# Patient Record
Sex: Female | Born: 1970
Health system: Southern US, Community
[De-identification: ages and names within clinical notes are randomized; demographics above are authoritative.]

## PROBLEM LIST (undated history)

## (undated) DIAGNOSIS — N3010 Interstitial cystitis (chronic) without hematuria: Secondary | ICD-10-CM

## (undated) DIAGNOSIS — F319 Bipolar disorder, unspecified: Secondary | ICD-10-CM

## (undated) DIAGNOSIS — M797 Fibromyalgia: Secondary | ICD-10-CM

## (undated) DIAGNOSIS — K589 Irritable bowel syndrome without diarrhea: Secondary | ICD-10-CM

## (undated) DIAGNOSIS — F410 Panic disorder [episodic paroxysmal anxiety] without agoraphobia: Secondary | ICD-10-CM

## (undated) DIAGNOSIS — F419 Anxiety disorder, unspecified: Secondary | ICD-10-CM

## (undated) DIAGNOSIS — F4481 Dissociative identity disorder: Secondary | ICD-10-CM

## (undated) DIAGNOSIS — G43909 Migraine, unspecified, not intractable, without status migrainosus: Secondary | ICD-10-CM

## (undated) DIAGNOSIS — F32A Depression, unspecified: Secondary | ICD-10-CM

## (undated) HISTORY — DX: Irritable bowel syndrome, unspecified: K58.9

## (undated) HISTORY — DX: Migraine, unspecified, not intractable, without status migrainosus: G43.909

## (undated) HISTORY — PX: OTHER SURGICAL HISTORY: SHX169

## (undated) HISTORY — DX: Fibromyalgia: M79.7

## (undated) HISTORY — DX: Interstitial cystitis (chronic) without hematuria: N30.10

## (undated) HISTORY — PX: BREAST LUMPECTOMY: SHX2

## (undated) HISTORY — PX: ABDOMINAL HYSTERECTOMY: SHX81

## (undated) HISTORY — PX: CHOLECYSTECTOMY: SHX55

## (undated) HISTORY — PX: BLADDER SURGERY: SHX569

## (undated) HISTORY — PX: APPENDECTOMY: SHX54

## (undated) HISTORY — DX: Depression, unspecified: F32.A

---

## 2000-02-20 ENCOUNTER — Encounter: Admission: RE | Admit: 2000-02-20 | Discharge: 2000-02-20 | Payer: Self-pay | Admitting: Gastroenterology

## 2000-02-20 ENCOUNTER — Encounter: Payer: Self-pay | Admitting: Gastroenterology

## 2000-04-09 ENCOUNTER — Encounter (INDEPENDENT_AMBULATORY_CARE_PROVIDER_SITE_OTHER): Payer: Self-pay | Admitting: Specialist

## 2000-04-09 ENCOUNTER — Inpatient Hospital Stay (HOSPITAL_COMMUNITY): Admission: RE | Admit: 2000-04-09 | Discharge: 2000-04-11 | Payer: Self-pay | Admitting: Obstetrics and Gynecology

## 2002-01-26 ENCOUNTER — Encounter: Payer: Self-pay | Admitting: Obstetrics and Gynecology

## 2002-01-26 ENCOUNTER — Encounter: Admission: RE | Admit: 2002-01-26 | Discharge: 2002-01-26 | Payer: Self-pay | Admitting: Obstetrics and Gynecology

## 2002-02-03 ENCOUNTER — Ambulatory Visit (HOSPITAL_BASED_OUTPATIENT_CLINIC_OR_DEPARTMENT_OTHER): Admission: RE | Admit: 2002-02-03 | Discharge: 2002-02-03 | Payer: Self-pay | Admitting: *Deleted

## 2002-02-03 ENCOUNTER — Encounter (INDEPENDENT_AMBULATORY_CARE_PROVIDER_SITE_OTHER): Payer: Self-pay | Admitting: Specialist

## 2002-08-05 ENCOUNTER — Encounter: Payer: Self-pay | Admitting: Internal Medicine

## 2002-08-05 ENCOUNTER — Emergency Department (HOSPITAL_COMMUNITY): Admission: EM | Admit: 2002-08-05 | Discharge: 2002-08-05 | Payer: Self-pay | Admitting: *Deleted

## 2003-06-02 ENCOUNTER — Emergency Department (HOSPITAL_COMMUNITY): Admission: EM | Admit: 2003-06-02 | Discharge: 2003-06-03 | Payer: Self-pay | Admitting: Emergency Medicine

## 2004-02-01 ENCOUNTER — Other Ambulatory Visit: Admission: RE | Admit: 2004-02-01 | Discharge: 2004-02-01 | Payer: Self-pay | Admitting: Obstetrics and Gynecology

## 2004-02-08 ENCOUNTER — Encounter: Admission: RE | Admit: 2004-02-08 | Discharge: 2004-02-08 | Payer: Self-pay | Admitting: Obstetrics and Gynecology

## 2004-03-06 ENCOUNTER — Encounter: Admission: RE | Admit: 2004-03-06 | Discharge: 2004-03-06 | Payer: Self-pay | Admitting: Gastroenterology

## 2004-05-14 ENCOUNTER — Encounter: Admission: RE | Admit: 2004-05-14 | Discharge: 2004-05-14 | Payer: Self-pay | Admitting: Obstetrics and Gynecology

## 2004-09-07 ENCOUNTER — Encounter
Admission: RE | Admit: 2004-09-07 | Discharge: 2004-09-07 | Payer: Self-pay | Admitting: Physical Medicine and Rehabilitation

## 2005-09-02 ENCOUNTER — Encounter: Admission: RE | Admit: 2005-09-02 | Discharge: 2005-09-02 | Payer: Self-pay | Admitting: Obstetrics & Gynecology

## 2005-10-05 ENCOUNTER — Emergency Department (HOSPITAL_COMMUNITY): Admission: EM | Admit: 2005-10-05 | Discharge: 2005-10-05 | Payer: Self-pay | Admitting: Emergency Medicine

## 2005-10-06 ENCOUNTER — Inpatient Hospital Stay (HOSPITAL_COMMUNITY): Admission: RE | Admit: 2005-10-06 | Discharge: 2005-10-08 | Payer: Self-pay | Admitting: Psychiatry

## 2005-10-06 ENCOUNTER — Ambulatory Visit: Payer: Self-pay | Admitting: Psychiatry

## 2006-03-31 ENCOUNTER — Ambulatory Visit: Payer: Self-pay | Admitting: Internal Medicine

## 2006-04-01 ENCOUNTER — Ambulatory Visit: Payer: Self-pay | Admitting: Internal Medicine

## 2006-05-04 ENCOUNTER — Ambulatory Visit: Payer: Self-pay | Admitting: Internal Medicine

## 2006-07-03 ENCOUNTER — Ambulatory Visit (HOSPITAL_COMMUNITY): Admission: RE | Admit: 2006-07-03 | Discharge: 2006-07-03 | Payer: Self-pay | Admitting: *Deleted

## 2006-07-03 ENCOUNTER — Encounter (INDEPENDENT_AMBULATORY_CARE_PROVIDER_SITE_OTHER): Payer: Self-pay | Admitting: Specialist

## 2008-02-16 ENCOUNTER — Ambulatory Visit: Payer: Self-pay | Admitting: Internal Medicine

## 2008-04-04 ENCOUNTER — Encounter (INDEPENDENT_AMBULATORY_CARE_PROVIDER_SITE_OTHER): Payer: Self-pay | Admitting: Urology

## 2008-04-04 ENCOUNTER — Ambulatory Visit (HOSPITAL_BASED_OUTPATIENT_CLINIC_OR_DEPARTMENT_OTHER): Admission: RE | Admit: 2008-04-04 | Discharge: 2008-04-04 | Payer: Self-pay | Admitting: Urology

## 2008-12-22 ENCOUNTER — Encounter: Payer: Self-pay | Admitting: Internal Medicine

## 2009-08-23 ENCOUNTER — Encounter: Admission: RE | Admit: 2009-08-23 | Discharge: 2009-09-20 | Payer: Self-pay | Admitting: Anesthesiology

## 2010-12-22 ENCOUNTER — Encounter: Payer: Self-pay | Admitting: Internal Medicine

## 2011-04-15 NOTE — Op Note (Signed)
NAMEADRAINE, Alison Blake             ACCOUNT NO.:  [id number]   MEDICAL RECORD NO.:  [id number]          PATIENT TYPE:  AMB   LOCATION:  [facility]                         FACILITY:  [facility]   PHYSICIAN:  Jamison Neighbor, M.D.  DATE OF BIRTH:  [birth year]   DATE OF PROCEDURE:  04/04/2008  DATE OF DISCHARGE:                               OPERATIVE REPORT   PREOPERATIVE DIAGNOSIS:  Interstitial cystitis.   POSTOPERATIVE DIAGNOSIS:  Interstitial cystitis.   PROCEDURE:  Cystoscopy, urethral calibration, hydrodistention of the  bladder, Marcaine and Pyridium instillation, Marcaine and Kenalog  injection, bladder biopsy for mast cell analysis.   SURGEON:  Jamison Neighbor, M.D.   ANESTHESIA:  General.   COMPLICATIONS:  None.   DRAINS:  None.   BRIEF HISTORY:  This 40 year old female has had chronic pelvic pain.  She is to undergo evaluation to determine if she might have interstitial  cystitis.  The patient does have a history of possible endometriosis,  and it has been noted that there was a small tiny lesion in the right  side of the vagina, and there has been some question on the part of her  gynecologist whether this might represent recurrent endometriosis.  The  patient is now to undergo cystoscopy, urethral calibration,  hydrodistention of the bladder, Marcaine and Pyridium instillation,  Marcaine and Kenalog injection, and examination under anesthesia to  evaluate this lesion.  She understands the risks and benefits of the  procedure.  She is aware of the fact that there is no guarantee she will  have improvement in her symptoms, but if she does have interstitial  cystitis, there is a reasonable chance that she will have at least  temporary relief from her pain.  Relief, however, will not occur for at  least 1 week.  She gave full informed consent.   PROCEDURE:  After successful induction of general anesthesia, the  patient was placed in the dorsal position, prepped with Betadine,  and  draped in the usual sterile fashion.   Careful bimanual examination revealed an unremarkable vagina, with the  exception of one tiny benign-appearing lesion on the right-hand side.  This was actually difficult to visualize and can be felt more than seen.  It certainly did not represent significant evidence of any  endometriosis.  There was no active bleeding associated with this small  lesion which certainly appeared to be benign in nature.  It was quite  tiny, certainly not measuring anymore than 1 mm.  The urethra was  calibrated to 32-French with female urethral sounds, with no evidence of  stenosis or stricture.  The cystoscope was inserted.  The bladder was  carefully inspected.  No tumors or stones could be seen. Both ureteral  orifices were normal in configuration and location.  Careful inspection  showed no evidence of any endometrial implants within the bladder.  Hydrodistention of bladder was performed.  The bladder was distended at  a pressure of 100 cm of water for 5 minutes.  When the bladder was  drained, glomerulations could seen throughout the bladder.  These were  felt to be consistent with  interstitial cystitis.  A bladder capacity of  about 700 mL was diminished while not low as an average IC bladder  capacity of 575.  This certainly was less than the normal bladder  capacity of 1150 mL.  The patient had a biopsy performed.  The biopsy  site was cauterized.  This will be sent for mast cell analysis.  The  bladder was drained.  A mixture of Marcaine and Pyridium was left in the  bladder.  Marcaine and Kenalog were injected periurethrally.   The patient tolerated the procedure and was taken to the recovery room  in good condition.      Jamison Neighbor, M.D.  Electronically Signed     RJE/MEDQ  D:  04/04/2008  T:  04/04/2008  Job:  [id number]

## 2011-04-15 NOTE — Assessment & Plan Note (Signed)
[facility]                         GASTROENTEROLOGY OFFICE NOTE   NAME:WRIGHTCheney, Gosch                    MRN:          [id number]  DATE:02/16/2008                            DOB:          July 28, 1971    Ms. Chuong is a 40 year old white female with chronic abdominal pain,  history of endometriosis, followed by Dr. Maudry Diego, now with some urinary  symptoms, suspicious of interstitial cystitis.  She has symptomatic  gastroesophageal reflux, evaluated by upper endoscopy in June 2007.  Her  CLOtest was negative.  Last colonoscopy for evaluation of constipation  was in June 2007 and showed small internal hemorrhoids, no evidence of  mucosal disease.  There were no polyps.   The patient's symptoms continue to be those of constant nausea,  constipation, lower abdominal pain, alternating left and right lower  quadrants, and heartburn.   MEDICATIONS:  1. Xanax 0.25 mg p.r.n.  2. MiraLax 17-34 g p.r.n.  3. Tramadol 50 mg up to 120 a month.  4. Equetrol 300 mg daily.  5. Gynodiol 1 mg p.o. daily.   PHYSICAL EXAM:  Blood pressure 110/72, pulse 72 and weight 207 pounds.  Patient was in no distress.  She appeared somewhat depressed.  NECK:  Supple, no adenopathy.  LUNGS:  Clear to auscultation.  COR:  With quiet precordium, normal S1, normal S2.  ABDOMEN:  Soft and nondistended, with normoactive bowel sounds.  There  was marked diffuse tenderness, more so in left lower quadrant and left  middle quadrant, without palpable mass or fullness.  Right lower  quadrant was quite tender and also in suprapubic area, over the bladder,  there was some discomfort.  RECTAL EXAM:  Perianal area was normal, but she had very tender anal  canal on digital exam, although there was no fissure.  On anoscopy, she  had small internal hemorrhoids, which were first-grade, without prolapse  or any thrombosis.  Stool was Hemoccult-negative.   IMPRESSION:  1. Patient is a  40 year old white female with history of pelvic      endometriosis, status post TAH, BSO, followed by Dr. Maudry Diego.  2. Suprapubic discomfort.  Rule out interstitial cystitis.  Patient is      to be referred to Dr. Logan Bores.  3. Irritable bowel syndrome/constipation, inadequately controlled.      Patient needs to increase her laxatives.  4. Gastroesophageal reflux disease, not treated.  Needs to be further      treated.  5. Status post remote cholecystectomy in 2007.   PLAN:  1. Pantoprazole 40 mg with 6 refills.  2. MiraLax 17 g three times a week on a regular basis, rather than      p.r.n.  3. Anusol-HC suppositories q.h.s. with 3 refills.  4. Phenergan 25 mg every 6 hours p.r.n. break-through nausea.     Hedwig Morton. Juanda Chance, MD  Electronically Signed    DMB/MedQ  DD: 02/16/2008  DT: 02/16/2008  Job #: [id number]   cc:   Christianne Borrow

## 2011-04-18 NOTE — H&P (Signed)
[place]  Patient:    Alison Blake, Alison Blake                      MRN: [id number] Attending:  Beather Arbour. Thomasena Edis, M.D. CC:         [facility] outpatient operating room             [facility] office                         History and Physical  HISTORY OF PRESENT ILLNESS:  The patient is a 40 year old GII, PII, who underwent a total abdominal hysterectomy in 1999, for severe dysmenorrhea and known endometriosis.  The patient did well for quite a while, until she presented in December, complaining of pain in her left lower quadrant.  She did a pain diary and was found to have pain most days.  Ultrasound was performed and revealed a simple cyst, 5.8 x 4.2 x 3.8 cm.  It had no worrisome features for malignancy.  The patient was followed expectantly and placed on oral contraceptives to promote resolution of the cyst.  The patients recent ultrasound showed the cyst still present at 2.4 x 1.9 x 2.4 cm.  This has remained, in essence, unchanged since January, 2001.  The patient continues to complain of debilitating left lower quadrant pain such that she has to miss work.  She is quite debilitated by this pain.  She had a GI work-up by Dr.  Loreta Ave, which was negative, with the exception of a diagnosis of irritable bowel syndrome.  The patient describes the pain as a "pulling pain" and states it feels "as if it is going to pull right through to her vagina."  She fully understands that to remove this ovary, leaves her one ovary, and that at age 59, I do not think it is advisable unless absolutely necessary to remove both ovaries due to the induction of menopause that this would cause.  The patient is adamant that this ovary be removed, and she is admitted for a left oophorectomy, due to persistent ovarian cysts, which are not resolved on oral contraceptives, as well as severe and debilitating left lower quadrant pain.  The patient  fully understands that this left oophorectomy does not guarantee that she will be pain-free, and she expresses understanding and acceptance of these risks.  Risks of surgery, including anesthetic complication, hemorrhage, infection, damage to adjacent structures, including bowel, bladder, blood vessels and ureters, was discussed with the patient.  She is made aware of the risk of deep venous thrombosis, which could result in a pulmonary embolism or stroke.  She expresses understanding and acceptance of these risks.  Ultrasound does show the ovaries to be located up against the pelvic sidewall and the patient does understand that, due to her previous surgery and endometriosis, that the section may be difficult.  She is thus admitted for exploratory laparotomy and left oophorectomy and probable lysis of adhesions.  PAST MEDICAL HISTORY:  Significant for hysterectomy, as above.  History of mood  disorder, or depression, treated by Dr. Milagros Evener.  History of laparoscopy, for endometriosis in 1998.  History of spontaneous vaginal delivery x 2. Occasional migraine headaches.  Also, history of endometriosis, as above.  FAMILY HISTORY:  Significant for retardation in paternal aunt, paternal cousin 2.  ALLERGIES:  PENICILLIN causes rash and hives.  CODEINE causes nausea and vomiting, DOXYCYCLINE.  SOCIAL HISTORY:  The  patient is married, has two children and works for ______  [employer].  MEDICATIONS:  Topamax 200 mg daily, Celexa 40 mg daily and Ortho-Cyclen oral contraceptive.  PHYSICAL EXAMINATION:  HEENT:  Normal.  NECK:  Supple, without thyromegaly.  LUNGS:  Clear to auscultation.  CARDIAC:  Exam - regular rate and rhythm.  ABDOMEN:  Soft, nontender; no hepatosplenomegaly.  Well-healed Pfannenstiel incision.  PELVIC:  BUS normal.  The patients vaginal cuff is well-healed.  Examination is  negative with the exception of the reproduction of the left lower  quadrant pain  with palpation over the left ovary.  Uterus and cervix are surgically absent.  RECTAL:  Examination is confirmatory.  ASSESSMENT/PLAN:  The patient is a 40 year old GII, PII, with a known history of endometriosis and adenomyosis, status post TAH in 1999, admitted for a left oophorectomy and lysis of adhesions due to persistent ovarian cysts, despite treatment with oral contraceptives and persistent debilitating pain.  The patient fully understands that to remove this ovary may not cure her pain, and expresses understanding and acceptance of this. DD:  04/09/00 TD:  04/09/00 Job: [id number]

## 2011-04-18 NOTE — Op Note (Signed)
Alison Blake, Alison Blake             ACCOUNT NO.:  [id number]   MEDICAL RECORD NO.:  [id number]          PATIENT TYPE:  AMB   LOCATION:  DAY                          FACILITY:  [facility]   PHYSICIAN:  Alfonse Ras, MD   DATE OF BIRTH:  1971/02/24   DATE OF PROCEDURE:  07/03/2006  DATE OF DISCHARGE:                                 OPERATIVE REPORT   PREOPERATIVE DIAGNOSIS:  Biliary dyskinesia.   POSTOPERATIVE DIAGNOSIS:  Biliary dyskinesia.  Normal cholangiogram.   PROCEDURE:  Laparoscopic cholecystectomy with intraoperative cholangiogram.   SURGEON:  Alfonse Ras, M.D.   ASSISTANT:  Sheppard Plumber. Earlene Plater, M.D.   ANESTHESIA:  General.   DESCRIPTION OF PROCEDURE:  The patient was taken to the operating room and  placed in the supine position.  After adequate general anesthesia was  induced using the endotracheal tube, the abdomen was prepped and draped in  the normal sterile fashion.  Through the transverse infra-umbilical incision  I dissected down the fascia.  The fascia was opened vertically and the  peritoneum was entered.  A #0 Vicryl pursestring was placed around the  fascial defect and the Hasson trocar was placed in the abdomen.  A  pneumoperitoneum was obtained.  A sub-xiphoid __________ trocar was  introduced under direct vision.  Two additional 5 mm trocars were placed in  the right abdomen.  The gallbladder was identified and retracted cephalad.  Dissecting at the infundibulum of the gallbladder and down to the neck, the  cystic duct was easily identified.  It was quite small.  It was clipped up  near the gallbladder.  A small ductotomy was made and a cholangiogram was  performed, which showed a normal common bile duct, normal filling of both  right and left hepatic ducts and free flow into the duodenum without filling  defects.  The cholangio-catheter was then removed.  The cystic duct was  clipped and divided.  The cystic artery was identified, clipped and divided  as  well.  The gallbladder was taken off the gallbladder bed using the Bovie  electrocautery and removed through the umbilical port.  Adequate hemostasis  was assured.  The fascial defect was closed with the previously-placed #0  Vicryl pursestring suture.  The skin incisions were closed with subcuticular  #4-0 Monocryl.  They were injected with 0.5% Marcaine.  Steri-Strip sterile  dressings were applied.   The patient tolerated the procedure well and went to the PACU in good  condition.      Alfonse Ras, MD  Electronically Signed     KRE/MEDQ  D:  07/03/2006  T:  07/03/2006  Job:  [id number]

## 2011-04-18 NOTE — Discharge Summary (Signed)
NAMEJAMEELAH, WATTS NO.:  [id number]   MEDICAL RECORD NO.:  [id number]          PATIENT TYPE:  IPS   LOCATION:  0403                          [facility]ILITY[facility]  [facility]   PHYSICIAN:  Jeanice Lim, M.D. DATE OF BIRTH:  1971/01/16   DATE OF ADMISSION:  10/06/2005  DATE OF DISCHARGE:  10/08/2005                                 DISCHARGE SUMMARY   IDENTIFYING DATA:  This is a voluntary admission for this 40 year old  married Caucasian female who apparently took several Xanax to go to sleep.  Reports normally does not sleep well, has bad dreams, had been dreaming  about snakes being in her bed, people chasing her, complaining of paranoia  and rapid mood swings.  Reports she can just be sitting there and then the  mood will suddenly shift.  Began seeing Dr. Evelene Croon in 2000.  Had been arrested  for shoplifting.   PRIMARY CARE PHYSICIAN:  [facility].   MEDICATIONS:  Estrogen, replaced with estradiol, Xanax 2 mg t.i.d. and  unclear regarding the other medications.  The patient was very concerned  about any medication that might cause her to gain weight.   PHYSICAL EXAMINATION:  Physical and neurologic exam essentially within  normal limits.  She is 5 feet 6 inches tall and weighs 193 pounds.  Vital  signs stable.  Afebrile.   LABORATORY DATA:  Routine admission labs within normal limits.   MENTAL STATUS EXAM:  Alert and oriented.  Appropriately dressed and groomed.  No psychomotor abnormalities.  Speech within normal limits.  Mood depressed.  Affect a bit labile, upset when she realized she would not be returning home  the same night of her admission.  Reports she has never been separated from  her children.  Thought processes were mostly goal directed.  Wanted to be  discharged.  Judgment and insight were impaired.  Cognition intact.  Denied  acute suicidal or homicidal ideation or auditory or visual hallucinations.  Reports that she does hear a voice at times,  stating I'm no good, I'm not  worth living and they call me fat..   ADMISSION DIAGNOSES:  AXIS I:  Bipolar disorder, mixed state possible with  psychotic features.  AXIS II:  Rule out personality disorder.  AXIS III:  Migraines, irritable bowel syndrome, obesity.  AXIS IV:  Moderate (stressors with primary support group, problems related  to legal system, had a shoplifting charge in 2000).  AXIS V:  28/55-60.   HOSPITAL COURSE:  The patient was admitted and ordered routine p.r.n.  medications and underwent further monitoring.  Was encouraged to participate  in individual, group and milieu therapy.  The patient signed a 72-hour  request for discharge and Dr. Carie Caddy office was contacted regarding safety  issues and treatment plan recommendations.  The patient was placed on a low-  dose Librium detox protocol, Haldol 5 mg p.r.n. agitation and Geodon was  started and given IM initially for agitation and psychosis and then b.i.d.  along with Depakote for mood instability and Ambien to restore sleep.  Depakote level was monitored and patient was discharged in  improved  condition, responding and tolerating the medications.  Risk/benefit ratio  and alternative treatments were discussed regarding medications.  The  patient was comfortable with medication regimen, aware of metabolic issues,  risk of TD, EPS and sedation precautions.   CONDITION ON DISCHARGE:  She was discharged with a stable mood, no dangerous  ideation, no psychotic symptoms, improved judgment and insight.  Given  medication education.   DISCHARGE MEDICATIONS:  She was to continue on estradiol as prescribed by  family doctor.  Geodon was stopped due to nausea and sedation.  The patient  was detoxed off of Xanax and Depakote had been started 500 mg at 8 p.m. and  Ambien 10 mg q.h.s. p.r.n.  The patient's TSH was 1.131 and Depakote level  was not available at the time of discharge but pending and could be sent to  Dr. Carie Caddy  office upon request.   DISCHARGE DIAGNOSES:  AXIS I:  Bipolar disorder, mixed state possible with  psychotic features.  AXIS II:  Rule out personality disorder.  AXIS III:  Migraines, irritable bowel syndrome, obesity.  AXIS IV:  Moderate (stressors with primary support group, problems related  to legal system, had a shoplifting charge in 2000).  AXIS V:  GAF on discharge 55.      Jeanice Lim, M.D.  Electronically Signed     JEM/MEDQ  D:  10/12/2005  T:  10/13/2005  Job:  [id number]

## 2011-04-18 NOTE — H&P (Signed)
Alison Blake, Alison Blake NO.:  [id number]   MEDICAL RECORD NO.:  [id number]          PATIENT TYPE:  IPS   LOCATION:  0403                          [facility]ACILITY:  [facility]   PHYSICIAN:  Jeanice Lim, M.D. DATE OF BIRTH:  16-Jan-1971   DATE OF ADMISSION:  10/06/2005  DATE OF DISCHARGE:                         PSYCHIATRIC ADMISSION ASSESSMENT   IDENTIFYING INFORMATION:  This is a voluntary admission to the services of  Dr. Aleatha Borer.  This is a 40 year old married white female.  Apparently,  she took several Xanax to go to sleep.  She does not sleep well.  She has  bad dreams.  Recently, she has been dreaming about snakes being in her bed  and people chasing after her.  She states if we could get a medicine that  would help fix the paranoia, that is what she would like.  She states that  she has rapid mood swings.  She states that she can just be sitting there,  not doing anything and her mood will just change on her.  She feels a  sensation go up her body and it just makes her change.  She states that  often she will then say things that other people find hurtful and she states  she does not really remember what she says to people while she is in these  states.  She assures me that Dr. Evelene Croon says that this is just her mood  swings.   PAST PSYCHIATRIC HISTORY:  She denies any inpatient stays.  Apparently, she  began seeing Dr. Evelene Croon in 2000 after she was arrested for shoplifting.   SOCIAL HISTORY:  She has a GED.  Apparently, she became pregnant after the  eighth grade and ultimately got a GED.  Her son will be 24 and her daughter  is almost 41.  She has only been married the one time.  Apparently, she is a  stay-at-home mom.   FAMILY HISTORY:  She denies but apparently her father is known to have mood  swings and she thinks her daughter may have bipolar disorder as well.   PRIMARY CARE PHYSICIAN:  She goes to [facility].   MEDICAL HISTORY:  She is status post  a hysterectomy at age 65.  She is now  also status post bilateral salpingo-oophorectomy and she has also had a  lumpectomy of the right breast.   MEDICATIONS:  She is estrogen replaced with estradiol.  She also takes Xanax  2 mg t.i.d. and she is not clear as to exactly what medications Dr. Evelene Croon has  her on currently.  Apparently, she took the prescription to the pharmacy but  could not afford it and she is very concerned about any medications that  might make her gain weight.   PHYSICAL EXAMINATION:  Physical examination is unremarkable except for being  somewhat obese.  Her physical is as per the ER.  She is 5 feet 6 inches and  weighs 193 pounds.  Her vital signs are stable.   LABORATORY DATA:  She had no other abnormal labs on the labs that were done  in the  emergency room.   MENTAL STATUS EXAM:  She is alert.  She is oriented x3.  She is  appropriately if be it casually groomed and dressed.  She is adequately  nourished.  Her speech is not pressured.  Her mood starts out depressed.  Her affect was flat but became labile.  She became very upset when she  realized that she would not be returning home tonight.  She states this is  the first time she has ever been separated from her children.  Her thought  processes are clear, rational and goal-oriented.  She wants discharge.  Judgment and insight are intact.  Concentration and memory are intact.  Intelligence is at least average.  She denies suicidal or homicidal  ideation.  She denies auditory or visual hallucinations per se.  She states  that she feels like people are outside the house watching her.  She does  hear voices at times stating I'm no good and I'm not worth living.  They  call me fat.  The voices call her fat.  She states that she has an  appointment this Friday with Dr. Evelene Croon that she would like to keep.  She  reports having been on a myriad of medications in the past without benefit  including lithium, Depakote,  Tegretol, Wellbutrin, Effexor, Zoloft and maybe  a couple of others.   DIAGNOSES:  AXIS I:  Bipolar disorder, mixed.  Panic, she states, and that  may be from being afraid.  She states that she takes the 2 mg of Xanax  t.i.d. whether she feels panic or not.  AXIS II:  Rule out personality disorder.  AXIS III:  Migraines, irritable bowel syndrome, obesity.  AXIS IV:  Problems with primary support group and problems related to the  legal system.  She had a shoplifting charge in 2000.  AXIS V:  28.   PLAN:  To admit for safety and stabilization.  The patient had already  signed a 72 hour request for discharge.  We will consult with Dr. Evelene Croon in  the morning and, if Dr. Evelene Croon is confident that the patient is not suicidal,  I imagine she can be discharged and let Dr. Evelene Croon and the patient resume  their therapeutic alliance.      Mickie Leonarda Salon, P.A.-C.      Jeanice Lim, M.D.  Electronically Signed    MD/MEDQ  D:  10/06/2005  T:  10/06/2005  Job:  [id number]

## 2012-07-19 ENCOUNTER — Other Ambulatory Visit: Payer: Self-pay | Admitting: Family Medicine

## 2012-07-19 DIAGNOSIS — Z1231 Encounter for screening mammogram for malignant neoplasm of breast: Secondary | ICD-10-CM

## 2012-08-04 ENCOUNTER — Ambulatory Visit: Payer: Self-pay

## 2012-08-04 ENCOUNTER — Ambulatory Visit
Admission: RE | Admit: 2012-08-04 | Discharge: 2012-08-04 | Disposition: A | Discharge status: Home / Self Care | Payer: Medicare Other | Source: Ambulatory Visit | Attending: Family Medicine | Admitting: Family Medicine

## 2012-08-04 DIAGNOSIS — Z1231 Encounter for screening mammogram for malignant neoplasm of breast: Secondary | ICD-10-CM

## 2012-08-24 ENCOUNTER — Other Ambulatory Visit: Payer: Self-pay | Admitting: Otolaryngology

## 2012-08-24 DIAGNOSIS — J329 Chronic sinusitis, unspecified: Secondary | ICD-10-CM

## 2012-09-03 ENCOUNTER — Ambulatory Visit
Admission: RE | Admit: 2012-09-03 | Discharge: 2012-09-03 | Disposition: A | Discharge status: Home / Self Care | Payer: Medicare Other | Source: Ambulatory Visit | Attending: Otolaryngology | Admitting: Otolaryngology

## 2012-09-03 DIAGNOSIS — J329 Chronic sinusitis, unspecified: Secondary | ICD-10-CM

## 2013-01-21 ENCOUNTER — Encounter (HOSPITAL_COMMUNITY): Payer: Self-pay | Admitting: Emergency Medicine

## 2013-01-21 ENCOUNTER — Emergency Department (HOSPITAL_COMMUNITY)
Admission: EM | Admit: 2013-01-21 | Discharge: 2013-01-21 | Disposition: A | Discharge status: Home / Self Care | Payer: Medicare Other | Attending: Emergency Medicine | Admitting: Emergency Medicine

## 2013-01-21 DIAGNOSIS — F319 Bipolar disorder, unspecified: Secondary | ICD-10-CM

## 2013-01-21 DIAGNOSIS — F411 Generalized anxiety disorder: Secondary | ICD-10-CM | POA: Insufficient documentation

## 2013-01-21 DIAGNOSIS — F17200 Nicotine dependence, unspecified, uncomplicated: Secondary | ICD-10-CM | POA: Insufficient documentation

## 2013-01-21 DIAGNOSIS — F609 Personality disorder, unspecified: Secondary | ICD-10-CM | POA: Insufficient documentation

## 2013-01-21 DIAGNOSIS — G4700 Insomnia, unspecified: Secondary | ICD-10-CM | POA: Insufficient documentation

## 2013-01-21 DIAGNOSIS — R443 Hallucinations, unspecified: Secondary | ICD-10-CM | POA: Insufficient documentation

## 2013-01-21 DIAGNOSIS — Z79899 Other long term (current) drug therapy: Secondary | ICD-10-CM | POA: Insufficient documentation

## 2013-01-21 DIAGNOSIS — F259 Schizoaffective disorder, unspecified: Secondary | ICD-10-CM | POA: Insufficient documentation

## 2013-01-21 HISTORY — DX: Dissociative identity disorder: F44.81

## 2013-01-21 HISTORY — DX: Panic disorder (episodic paroxysmal anxiety): F41.0

## 2013-01-21 HISTORY — DX: Bipolar disorder, unspecified: F31.9

## 2013-01-21 HISTORY — DX: Anxiety disorder, unspecified: F41.9

## 2013-01-21 LAB — COMPREHENSIVE METABOLIC PANEL
ALT: 10 U/L (ref 0–35)
AST: 14 U/L (ref 0–37)
BUN: 9 mg/dL (ref 6–23)
Creatinine, Ser: 0.91 mg/dL (ref 0.50–1.10)
GFR calc Af Amer: 90 mL/min — ABNORMAL LOW (ref >90)
GFR calc non Af Amer: 77 mL/min — ABNORMAL LOW (ref >90)
Glucose, Bld: 118 mg/dL — ABNORMAL HIGH (ref 70–99)

## 2013-01-21 LAB — RAPID URINE DRUG SCREEN, HOSP PERFORMED
Barbiturates: NOT DETECTED
Benzodiazepines: POSITIVE — AB
Opiates: NOT DETECTED
Tetrahydrocannabinol: NOT DETECTED

## 2013-01-21 LAB — CBC
MCHC: 35.4 g/dL (ref 30.0–36.0)
MCV: 93.8 fL (ref 78.0–100.0)
RBC: 4.37 MIL/uL (ref 3.87–5.11)

## 2013-01-21 LAB — ETHANOL: Alcohol, Ethyl (B): <11 mg/dL (ref 0–11)

## 2013-01-21 NOTE — ED Notes (Signed)
Pt very tearful in triage  Pt states she was made to come here  Pt's son states his father came to the house tonight with a couple of women that have been harassing his mother  States the police were there as well  Pt states the women were standing in the street yelling and cursing because she did not want them on her property  Pt states she became upset and said some things out of anger the police took as she had intent to hurt herself and made her come in here  Pt has bipolar, borderline schizophrenia, and multiple personality disorder  Pt states she does have a hx of attempting suicide back in 2006  Pt and the father of her children are no longer together and have not been for about 5 years
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
## 2013-01-21 NOTE — ED Provider Notes (Signed)
Medical screening examination/treatment/procedure(s) were performed by non-physician practitioner and as supervising physician I was immediately available for consultation/collaboration.  Jones Skene, M.D.     Jones Skene, MD 01/21/13 1106

## 2013-01-21 NOTE — ED Notes (Signed)
PA in room with pt.

## 2013-01-21 NOTE — ED Notes (Signed)
While pt in triage she made comments like "living in hell would be better than living in this hell"

## 2013-12-13 ENCOUNTER — Other Ambulatory Visit: Payer: Self-pay

## 2013-12-13 DIAGNOSIS — Z1231 Encounter for screening mammogram for malignant neoplasm of breast: Secondary | ICD-10-CM

## 2014-01-04 ENCOUNTER — Ambulatory Visit
Admission: RE | Admit: 2014-01-04 | Discharge: 2014-01-04 | Disposition: A | Discharge status: Home / Self Care | Payer: Medicare Other | Source: Ambulatory Visit

## 2014-01-04 ENCOUNTER — Other Ambulatory Visit: Payer: Self-pay

## 2014-01-04 DIAGNOSIS — Z1231 Encounter for screening mammogram for malignant neoplasm of breast: Secondary | ICD-10-CM

## 2014-11-08 ENCOUNTER — Other Ambulatory Visit: Payer: Self-pay | Admitting: Family Medicine

## 2014-11-08 DIAGNOSIS — N6459 Other signs and symptoms in breast: Secondary | ICD-10-CM

## 2014-11-22 ENCOUNTER — Ambulatory Visit
Admission: RE | Admit: 2014-11-22 | Discharge: 2014-11-22 | Disposition: A | Discharge status: Home / Self Care | Payer: Medicare Other | Source: Ambulatory Visit | Attending: Family Medicine | Admitting: Family Medicine

## 2014-11-22 DIAGNOSIS — N6459 Other signs and symptoms in breast: Secondary | ICD-10-CM

## 2015-01-05 ENCOUNTER — Other Ambulatory Visit: Payer: Self-pay

## 2015-01-05 ENCOUNTER — Other Ambulatory Visit: Payer: Self-pay | Admitting: Family Medicine

## 2015-01-05 DIAGNOSIS — N644 Mastodynia: Secondary | ICD-10-CM

## 2015-01-09 ENCOUNTER — Other Ambulatory Visit: Payer: Self-pay | Admitting: Family Medicine

## 2015-01-09 DIAGNOSIS — N644 Mastodynia: Secondary | ICD-10-CM

## 2015-01-16 ENCOUNTER — Ambulatory Visit
Admission: RE | Admit: 2015-01-16 | Discharge: 2015-01-16 | Disposition: A | Discharge status: Home / Self Care | Payer: Medicaid Other | Source: Ambulatory Visit | Attending: Family Medicine | Admitting: Family Medicine

## 2015-01-16 DIAGNOSIS — N644 Mastodynia: Secondary | ICD-10-CM

## 2016-01-07 ENCOUNTER — Encounter: Payer: Self-pay | Admitting: Gastroenterology

## 2016-03-07 ENCOUNTER — Encounter: Payer: Self-pay | Admitting: Gastroenterology

## 2016-03-07 ENCOUNTER — Ambulatory Visit (INDEPENDENT_AMBULATORY_CARE_PROVIDER_SITE_OTHER): Payer: Medicare Other | Admitting: Gastroenterology

## 2016-03-07 VITALS — BP 100/76 | HR 80 | Ht 66.75 in | Wt 178.1 lb

## 2016-03-07 DIAGNOSIS — K6289 Other specified diseases of anus and rectum: Secondary | ICD-10-CM

## 2016-03-07 DIAGNOSIS — K5900 Constipation, unspecified: Secondary | ICD-10-CM | POA: Diagnosis not present

## 2016-03-07 MED ORDER — AMBULATORY NON FORMULARY MEDICATION: Status: DC

## 2016-03-07 NOTE — Patient Instructions (Signed)
We have sent in Nitroglycerin ointment to [facility] 1 tablespoon three times a day Use Miralax 1 capful daily then decrease

## 2016-03-20 NOTE — Progress Notes (Signed)
Alison Blake    [id number]    [birth year]  Primary Care Physician: Duane Lope, MD  Referring Physician: No referring provider defined for this encounter.  Chief complaint:  Pain in the tail bone HPI: 78 yr F here for new patient visit with c/o pain the tail bone. She has chronic constipation and sometimes has hard stools, denies excessive staining. S/p defecation pain is worse sometimes but she has pain unrelated to bowel movement as well. Its getting to be more constant. Denies any trauma.   Outpatient Encounter Prescriptions as of 03/07/2016  Medication Sig  . alprazolam (XANAX) 2 MG tablet Take 2 mg by mouth 3 (three) times daily as needed for anxiety.   Marland Kitchen amitriptyline (ELAVIL) 25 MG tablet Take 25 mg by mouth at bedtime.  Marland Kitchen amphetamine-dextroamphetamine (ADDERALL) 30 MG tablet Take 30 mg by mouth daily.  Marland Kitchen BIOTIN PO Take 1 capsule by mouth every morning.  . bismuth subsalicylate (PEPTO BISMOL) 262 MG/15ML suspension Take 15 mLs by mouth every 6 (six) hours as needed for indigestion.  . carbamazepine (TEGRETOL) 200 MG tablet Take 200 mg by mouth at bedtime.  . Cyanocobalamin (VITAMIN B 12 PO) Take 1 tablet by mouth every morning.  Marland Kitchen estradiol (ESTRACE) 2 MG tablet Take 2 mg by mouth daily.  . Multiple Vitamin (MULTIVITAMIN WITH MINERALS) TABS Take 1 tablet by mouth every morning.  Marland Kitchen oxyCODONE-acetaminophen (PERCOCET) 10-325 MG per tablet Take 1 tablet by mouth every 4 (four) hours as needed for pain (up to 5 per day).  . RaNITidine HCl (EQ RANITIDINE PO) Take 300 mg by mouth 3 (three) times daily.  . risperiDONE (RISPERDAL) 1 MG tablet Take 1 mg by mouth at bedtime.  . SUMAtriptan (IMITREX) 100 MG tablet Take 100 mg by mouth as needed for migraine. May repeat in 2 hours if headache persists or recurs.  Marland Kitchen zolpidem (AMBIEN) 10 MG tablet Take 10 mg by mouth at bedtime as needed for sleep.  . AMBULATORY NON FORMULARY MEDICATION Medication Name: Nitroglycerin ointment  0.125 % pea sized amount per rectum every 8 hours for 2 months  . [DISCONTINUED] carisoprodol (SOMA) 350 MG tablet Take 350 mg by mouth at bedtime.  . [DISCONTINUED] estradiol (ESTRACE) 1 MG tablet Take 2 mg by mouth at bedtime.   . [DISCONTINUED] omeprazole (PRILOSEC) 40 MG capsule Take 40 mg by mouth daily as needed (for heartburn).  . [DISCONTINUED] ranitidine (ZANTAC) 75 MG tablet Take 150 mg by mouth 2 (two) times daily as needed for heartburn.   No facility-administered encounter medications on file as of 03/07/2016.    Allergies as of 03/07/2016 - Review Complete 03/07/2016  Allergen Reaction Noted  . Penicillins Hives 01/21/2013    Past Medical History  Diagnosis Date  . Bipolar 1 disorder (HCC)   . Multiple personality disorder   . Anxiety   . Panic attacks   . Fibromyalgia   . Interstitial cystitis   . IBS (irritable bowel syndrome)     Past Surgical History  Procedure Laterality Date  . Cholecystectomy    . Abdominal hysterectomy    . Appendectomy    . Breast lumpectomy Right   . Laproscopy      x 7  . Bladder surgery      hyperextension surgey    Family History  Problem Relation Age of Onset  . Hypertension Mother   . Diabetes Mother   . CAD Father   . Hypertension  Father   . Nephrolithiasis Mother   . Nephrolithiasis Father   . Nephrolithiasis Sister     Social History   Social History  . Marital Status: Married    Spouse Name: N/A  . Number of Children: 2  . Years of Education: N/A   Occupational History  . Not on file.   Social History Main Topics  . Smoking status: Current Some Day Smoker    Types: Cigarettes  . Smokeless tobacco: Never Used  . Alcohol Use: 0.0 oz/week    0 Standard drinks or equivalent per week     Comment: social  . Drug Use: No  . Sexual Activity: Not on file   Other Topics Concern  . Not on file   Social History Narrative      Review of systems: Review of Systems  Constitutional: Negative for fever and  chills.  HENT: Negative.   Eyes: Negative for blurred vision.  Respiratory: Negative for cough, shortness of breath and wheezing.   Cardiovascular: Negative for chest pain and palpitations.  Gastrointestinal: as per HPI Genitourinary: Negative for dysuria, urgency, frequency and hematuria.  Musculoskeletal: Negative for myalgias, back pain and joint pain.  Skin: Negative for itching and rash.  Neurological: Negative for dizziness, tremors, focal weakness, seizures and loss of consciousness.  Endo/Heme/Allergies: Negative for environmental allergies.  Psychiatric/Behavioral: Negative for depression, suicidal ideas and hallucinations.  All other systems reviewed and are negative.   Physical Exam: Filed Vitals:   03/07/16 1504  BP: 100/76  Pulse: 80   Gen:      No acute distress HEENT:  EOMI, sclera anicteric Neck:     No masses; no thyromegaly Lungs:    Clear to auscultation bilaterally; normal respiratory effort CV:         Regular rate and rhythm; no murmurs Abd:      + bowel sounds; soft, non-tender; no palpable masses, no distension Ext:    No edema; adequate peripheral perfusion Skin:      Warm and dry; no rash Neuro: alert and oriented x 3 Psych: normal mood and affect Rectal exam: Increased anal sphincter tone, no visible anal fissure but mild tenderness + posteriorly  Data Reviewed: Reviewed chart in epic  Assessment and Plan/Recommendations: 56 yr F with c/o proctalgia. Will manage like anal fissure and if continues to have no response may have to consider anorectal manometry for further evaluation 0.125% rectal nitroglycerine three times daily Benefiber 1 tablespoon TID with meals Titrate Miralax as needed to have 1-2 soft BM per day  Iona Beard , MD [phone] Mon-Fri 8a-5p [phone] after 5p, weekends, holidays  Return in 2 months

## 2016-05-14 ENCOUNTER — Ambulatory Visit: Payer: Medicare Other | Admitting: Gastroenterology

## 2016-05-22 ENCOUNTER — Other Ambulatory Visit: Payer: Self-pay | Admitting: Anesthesiology

## 2016-05-22 ENCOUNTER — Ambulatory Visit
Admission: RE | Admit: 2016-05-22 | Discharge: 2016-05-22 | Disposition: A | Discharge status: Home / Self Care | Payer: Medicare Other | Source: Ambulatory Visit | Attending: Anesthesiology | Admitting: Anesthesiology

## 2016-05-22 DIAGNOSIS — S239XXA Sprain of unspecified parts of thorax, initial encounter: Secondary | ICD-10-CM

## 2016-05-22 DIAGNOSIS — M5459 Other low back pain: Secondary | ICD-10-CM

## 2016-05-22 DIAGNOSIS — M545 Low back pain: Secondary | ICD-10-CM

## 2016-07-06 ENCOUNTER — Emergency Department (HOSPITAL_COMMUNITY): Payer: Medicare Other

## 2016-07-06 ENCOUNTER — Emergency Department (HOSPITAL_COMMUNITY)
Admission: EM | Admit: 2016-07-06 | Discharge: 2016-07-06 | Disposition: A | Discharge status: Home / Self Care | Payer: Medicare Other | Attending: Emergency Medicine | Admitting: Emergency Medicine

## 2016-07-06 ENCOUNTER — Encounter (HOSPITAL_COMMUNITY): Payer: Self-pay | Admitting: Nurse Practitioner

## 2016-07-06 DIAGNOSIS — R079 Chest pain, unspecified: Secondary | ICD-10-CM

## 2016-07-06 DIAGNOSIS — R109 Unspecified abdominal pain: Secondary | ICD-10-CM | POA: Diagnosis not present

## 2016-07-06 DIAGNOSIS — R059 Cough, unspecified: Secondary | ICD-10-CM

## 2016-07-06 DIAGNOSIS — J189 Pneumonia, unspecified organism: Secondary | ICD-10-CM | POA: Insufficient documentation

## 2016-07-06 DIAGNOSIS — R110 Nausea: Secondary | ICD-10-CM | POA: Diagnosis not present

## 2016-07-06 DIAGNOSIS — Z79899 Other long term (current) drug therapy: Secondary | ICD-10-CM | POA: Insufficient documentation

## 2016-07-06 DIAGNOSIS — R0602 Shortness of breath: Secondary | ICD-10-CM | POA: Diagnosis present

## 2016-07-06 DIAGNOSIS — R05 Cough: Secondary | ICD-10-CM

## 2016-07-06 DIAGNOSIS — F17210 Nicotine dependence, cigarettes, uncomplicated: Secondary | ICD-10-CM | POA: Insufficient documentation

## 2016-07-06 LAB — CBC WITH DIFFERENTIAL/PLATELET
Basophils Absolute: 0 10*3/uL (ref 0.0–0.1)
Basophils Relative: 0 %
EOS ABS: 0 10*3/uL (ref 0.0–0.7)
Eosinophils Relative: 0 %
HEMATOCRIT: 41.6 % (ref 36.0–46.0)
HEMOGLOBIN: 14.6 g/dL (ref 12.0–15.0)
LYMPHS ABS: 0.5 10*3/uL — AB (ref 0.7–4.0)
Lymphocytes Relative: 9 %
MCH: 33.6 pg (ref 26.0–34.0)
MCHC: 35.1 g/dL (ref 30.0–36.0)
MCV: 95.9 fL (ref 78.0–100.0)
MONO ABS: 0.3 10*3/uL (ref 0.1–1.0)
MONOS PCT: 5 %
NEUTROS ABS: 5.4 10*3/uL (ref 1.7–7.7)
NEUTROS PCT: 86 %
Platelets: 157 10*3/uL (ref 150–400)
RBC: 4.34 MIL/uL (ref 3.87–5.11)
RDW: 12.7 % (ref 11.5–15.5)
WBC: 6.2 10*3/uL (ref 4.0–10.5)

## 2016-07-06 LAB — COMPREHENSIVE METABOLIC PANEL
ALK PHOS: 65 U/L (ref 38–126)
ALT: 12 U/L — ABNORMAL LOW (ref 14–54)
ANION GAP: 7 (ref 5–15)
AST: 18 U/L (ref 15–41)
Albumin: 4.8 g/dL (ref 3.5–5.0)
BILIRUBIN TOTAL: 0.9 mg/dL (ref 0.3–1.2)
BUN: 13 mg/dL (ref 6–20)
CALCIUM: 9.2 mg/dL (ref 8.9–10.3)
CO2: 26 mmol/L (ref 22–32)
Chloride: 102 mmol/L (ref 101–111)
Creatinine, Ser: 0.82 mg/dL (ref 0.44–1.00)
GFR calc non Af Amer: >60 mL/min (ref >60)
Glucose, Bld: 104 mg/dL — ABNORMAL HIGH (ref 65–99)
Potassium: 3.9 mmol/L (ref 3.5–5.1)
Sodium: 135 mmol/L (ref 135–145)
TOTAL PROTEIN: 8.3 g/dL — AB (ref 6.5–8.1)

## 2016-07-06 LAB — I-STAT CG4 LACTIC ACID, ED: LACTIC ACID, VENOUS: 0.82 mmol/L (ref 0.5–1.9)

## 2016-07-06 LAB — I-STAT TROPONIN, ED: TROPONIN I, POC: 0 ng/mL (ref 0.00–0.08)

## 2016-07-06 MED ORDER — CYCLOBENZAPRINE HCL 10 MG PO TABS: 10.0000 mg | ORAL_TABLET | Freq: Two times a day (BID) | ORAL | 0 refills | Status: DC | PRN

## 2016-07-06 MED ORDER — AZITHROMYCIN 250 MG PO TABS: 250.0000 mg | ORAL_TABLET | Freq: Every day | ORAL | 0 refills | Status: AC

## 2016-07-06 MED ORDER — ONDANSETRON 4 MG PO TBDP: 4.0000 mg | ORAL_TABLET | Freq: Three times a day (TID) | ORAL | 0 refills | Status: DC | PRN

## 2016-07-06 MED ORDER — KETOROLAC TROMETHAMINE 30 MG/ML IJ SOLN
30.0000 mg | Freq: Once | INTRAMUSCULAR | Status: AC
  Administered 2016-07-06: 30 mg via INTRAVENOUS
  Filled 2016-07-06: qty 1

## 2016-07-06 MED ORDER — ONDANSETRON HCL 4 MG/2ML IJ SOLN
4.0000 mg | Freq: Once | INTRAMUSCULAR | Status: AC
  Administered 2016-07-06: 4 mg via INTRAVENOUS
  Filled 2016-07-06: qty 2

## 2016-07-06 MED ORDER — SODIUM CHLORIDE 0.9 % IV BOLUS (SEPSIS)
1000.0000 mL | Freq: Once | INTRAVENOUS | Status: AC
  Administered 2016-07-06: 1000 mL via INTRAVENOUS

## 2016-07-06 MED ORDER — FLUCONAZOLE 200 MG PO TABS: 200.0000 mg | ORAL_TABLET | Freq: Every day | ORAL | 0 refills | Status: AC

## 2016-07-06 MED ORDER — HYDROMORPHONE HCL 1 MG/ML IJ SOLN
1.0000 mg | Freq: Once | INTRAMUSCULAR | Status: AC
  Administered 2016-07-06: 0.5 mg via INTRAVENOUS
  Filled 2016-07-06: qty 1

## 2016-07-06 MED ORDER — ACETAMINOPHEN 325 MG PO TABS
650.0000 mg | ORAL_TABLET | Freq: Once | ORAL | Status: AC
  Administered 2016-07-06: 650 mg via ORAL
  Filled 2016-07-06: qty 2

## 2016-07-06 NOTE — ED Triage Notes (Signed)
Pt is c/o of severe chest pain that radiates to the left arm and the back, also reports shortness of breath, cough and weakness, onset last night nothing alleviates her symptoms or worsens them, she states they have been constant.

## 2016-07-06 NOTE — ED Provider Notes (Signed)
[facility]-EMERGENCY DEPT Provider Note   CSN: [id number] Arrival date & time: 07/06/16  6237  First Provider Contact:  None       History   Chief Complaint Chief Complaint  Patient presents with  . Chest Pain  . Shortness of Breath  . Generalized Body Aches    HPI Alison Blake is a 45 y.o. female.  HPI  Cough started last night, wouldn't go away, nonproductive cough, Fever 101.7 at home Chest pain sharp, difficult to take deep breath and cough, radiates to the back, starts in the center, down back to left arm Severe pain Feel like throat closed Stomach cramping all over and nausea NO hx of PE/DVT/recent surgery. Drove 3hr last week but no longer trips        Past Medical History:  Diagnosis Date  . Anxiety   . Bipolar 1 disorder (HCC)   . Fibromyalgia   . IBS (irritable bowel syndrome)   . Interstitial cystitis   . Multiple personality disorder   . Panic attacks     There are no active problems to display for this patient.   Past Surgical History:  Procedure Laterality Date  . ABDOMINAL HYSTERECTOMY    . APPENDECTOMY    . BLADDER SURGERY     hyperextension surgey  . BREAST LUMPECTOMY Right   . CHOLECYSTECTOMY    . laproscopy     x 7    OB History    No data available       Home Medications    Prior to Admission medications   Medication Sig Start Date End Date Taking? Authorizing Provider  alprazolam Prudy Feeler) 2 MG tablet Take 2 mg by mouth 3 (three) times daily as needed for anxiety.    Yes Historical Provider, MD  amitriptyline (ELAVIL) 25 MG tablet Take 25 mg by mouth at bedtime as needed for sleep.    Yes Historical Provider, MD  amphetamine-dextroamphetamine (ADDERALL) 30 MG tablet Take 30 mg by mouth daily as needed (for hyperactivity).    Yes Historical Provider, MD  Biotin (PA BIOTIN) 1000 MCG tablet Take 1,000 mcg by mouth daily.   Yes Historical Provider, MD  bismuth subsalicylate (PEPTO BISMOL) 262 MG/15ML suspension Take 30 mLs by  mouth every 6 (six) hours as needed for indigestion.    Yes Historical Provider, MD  estradiol (ESTRACE) 2 MG tablet Take 2 mg by mouth at bedtime.    Yes Historical Provider, MD  oxyCODONE-acetaminophen (PERCOCET) 10-325 MG per tablet Take 1 tablet by mouth every 4 (four) hours as needed for pain.    Yes Historical Provider, MD  ranitidine (ZANTAC) 150 MG tablet Take 300 mg by mouth 3 (three) times daily.   Yes Historical Provider, MD  risperiDONE (RISPERDAL) 1 MG tablet Take 1 mg by mouth at bedtime.   Yes Historical Provider, MD  SUMAtriptan (IMITREX) 100 MG tablet Take 100 mg by mouth as needed for migraine.    Yes Historical Provider, MD  vitamin B-12 (CYANOCOBALAMIN) 1000 MCG tablet Take 1,000 mcg by mouth daily.   Yes Historical Provider, MD  zolpidem (AMBIEN) 10 MG tablet Take 10 mg by mouth at bedtime as needed for sleep.   Yes Historical Provider, MD  azithromycin (ZITHROMAX Z-PAK) 250 MG tablet Take 1 tablet (250 mg total) by mouth daily. Take 2 tablets on the first day, followed by one tablet daily for 4 days. 07/06/16 07/11/16  Alvira Monday, MD  cyclobenzaprine (FLEXERIL) 10 MG tablet Take 1 tablet (10 mg total)  by mouth 2 (two) times daily as needed for muscle spasms. 07/06/16   Alvira Monday, MD  fluconazole (DIFLUCAN) 200 MG tablet Take 1 tablet (200 mg total) by mouth daily. 07/06/16 07/07/16  Alvira Monday, MD  ondansetron (ZOFRAN ODT) 4 MG disintegrating tablet Take 1 tablet (4 mg total) by mouth every 8 (eight) hours as needed for nausea or vomiting. 07/06/16   Alvira Monday, MD    Family History Family History  Problem Relation Age of Onset  . Hypertension Mother   . Diabetes Mother   . Nephrolithiasis Mother   . CAD Father   . Hypertension Father   . Nephrolithiasis Father   . Nephrolithiasis Sister     Social History Social History  Substance Use Topics  . Smoking status: Current Some Day Smoker    Types: Cigarettes  . Smokeless tobacco: Never Used  . Alcohol use  0.0 oz/week     Comment: social     Allergies   Penicillins   Review of Systems Review of Systems  Constitutional: Positive for activity change, appetite change, chills, fatigue and fever.  HENT: Negative for sore throat.   Eyes: Negative for visual disturbance.  Respiratory: Positive for cough and shortness of breath.   Cardiovascular: Positive for chest pain. Negative for leg swelling.  Gastrointestinal: Positive for abdominal pain (mild cramping) and nausea. Negative for constipation and diarrhea.  Genitourinary: Negative for difficulty urinating and dysuria.  Musculoskeletal: Positive for arthralgias and myalgias. Negative for back pain and neck pain.  Skin: Negative for rash.  Neurological: Positive for headaches. Negative for syncope.     Physical Exam Updated Vital Signs BP 125/77 (BP Location: Left Arm)   Pulse 103   Temp 101 F (38.3 C) (Oral)   Resp 19   Ht 5\' 8"  (1.727 m)   Wt 180 lb (81.6 kg)   SpO2 98%   BMI 27.37 kg/m   Physical Exam  Constitutional: She is oriented to person, place, and time. She appears well-developed and well-nourished. No distress.  Appears anxious  HENT:  Head: Normocephalic and atraumatic.  Eyes: Conjunctivae and EOM are normal.  Neck: Normal range of motion.  Cardiovascular: Regular rhythm, normal heart sounds and intact distal pulses.  Tachycardia present.  Exam reveals no gallop and no friction rub.   No murmur heard. Pulmonary/Chest: Effort normal. No respiratory distress. She has no wheezes. She has rales (left).  Abdominal: Soft. She exhibits no distension. There is no tenderness. There is no guarding.  Musculoskeletal: She exhibits no edema or tenderness.  Neurological: She is alert and oriented to person, place, and time.  Skin: Skin is warm and dry. No rash noted. She is not diaphoretic. No erythema.  Nursing note and vitals reviewed.    ED Treatments / Results  Labs (all labs ordered are listed, but only abnormal  results are displayed) Labs Reviewed  CBC WITH DIFFERENTIAL/PLATELET - Abnormal; Notable for the following:       Result Value   Lymphs Abs 0.5 (*)    All other components within normal limits  COMPREHENSIVE METABOLIC PANEL - Abnormal; Notable for the following:    Glucose, Bld 104 (*)    Total Protein 8.3 (*)    ALT 12 (*)    All other components within normal limits  I-STAT TROPOININ, ED  I-STAT CG4 LACTIC ACID, ED    EKG  EKG Interpretation  Date/Time:  Sunday July 06 2016 18:24:55 EDT Ventricular Rate:  117 PR Interval:    QRS  Duration: 76 QT Interval:  303 QTC Calculation: 423 R Axis:   52 Text Interpretation:  Sinus tachycardia Nonspecific repol abnormality, diffuse leads Tachycardia and repolarization abnormality are new from prior Confirmed by [facility] MD, Maritta Kief ([id number]) on 07/06/2016 6:43:14 PM       Radiology Dg Chest 2 View  Result Date: 07/06/2016 CLINICAL DATA:  Patient with shortness of breath and chest pain. EXAM: CHEST  2 VIEW COMPARISON:  Thoracic spine radiograph 05/22/2016. FINDINGS: Monitoring leads overlie the patient. Normal cardiac and mediastinal contours. Consolidative opacities within the left lower hemi thorax. No pleural effusion or pneumothorax. Regional skeleton is unremarkable. IMPRESSION: Consolidation within the left lower lung raising the possibility of pneumonia in the appropriate clinical setting. Followup PA and lateral chest X-ray is recommended in 3-4 weeks following trial of antibiotic therapy to ensure resolution and exclude underlying malignancy. Electronically Signed   By: Annia Belt M.D.   On: 07/06/2016 19:15    Procedures Procedures (including critical care time)  Medications Ordered in ED Medications  sodium chloride 0.9 % bolus 1,000 mL (0 mLs Intravenous Stopped 07/06/16 2130)  HYDROmorphone (DILAUDID) injection 1 mg (0.5 mg Intravenous Given 07/06/16 1909)  ondansetron (ZOFRAN) injection 4 mg (4 mg Intravenous Given 07/06/16 2116)   ketorolac (TORADOL) 30 MG/ML injection 30 mg (30 mg Intravenous Given 07/06/16 2117)  acetaminophen (TYLENOL) tablet 650 mg (650 mg Oral Given 07/06/16 2117)     Initial Impression / Assessment and Plan / ED Course  I have reviewed the triage vital signs and the nursing notes.  Pertinent labs & imaging results that were available during my care of the patient were reviewed by me and considered in my medical decision making (see chart for details).  Clinical Course   45yo female with history of fibromyalgia, IBS, bipolar, interstitial cystitis, presents with cough, fever and sharp chest pain.  ECG with tachycardia and ST changes likely rate related. Labs show normal troponin, normal lactic acid. Doubt ACS by hx, physical, duration of symptoms. No PE risk factors.  Given fever at home, Temp 100 in ED, cough and CXR showing left lower lung consolidation, feel patient likely has pneumonia. Pt appropriate for outpt treatment, normal O2 on room air.  Gave rx for azithromycin, zofran, and flexeril for body aches. Recommend PCP follow up and return if symptoms worsen. Patient discharged in stable condition with understanding of reasons to return.   Final Clinical Impressions(s) / ED Diagnoses   Final diagnoses:  CAP (community acquired pneumonia)  Chest pain, unspecified chest pain type  Cough    New Prescriptions Discharge Medication List as of 07/06/2016  9:57 PM    START taking these medications   Details  azithromycin (ZITHROMAX Z-PAK) 250 MG tablet Take 1 tablet (250 mg total) by mouth daily. Take 2 tablets on the first day, followed by one tablet daily for 4 days., Starting Sun 07/06/2016, Until Fri 07/11/2016, Print    cyclobenzaprine (FLEXERIL) 10 MG tablet Take 1 tablet (10 mg total) by mouth 2 (two) times daily as needed for muscle spasms., Starting Sun 07/06/2016, Print    fluconazole (DIFLUCAN) 200 MG tablet Take 1 tablet (200 mg total) by mouth daily., Starting Sun 07/06/2016, Until Mon  07/07/2016, Print    ondansetron (ZOFRAN ODT) 4 MG disintegrating tablet Take 1 tablet (4 mg total) by mouth every 8 (eight) hours as needed for nausea or vomiting., Starting Sun 07/06/2016, Print         Alvira Monday, MD 07/07/16 1600

## 2016-09-02 ENCOUNTER — Other Ambulatory Visit: Payer: Self-pay | Admitting: Family Medicine

## 2016-09-02 ENCOUNTER — Ambulatory Visit
Admission: RE | Admit: 2016-09-02 | Discharge: 2016-09-02 | Disposition: A | Discharge status: Home / Self Care | Payer: Medicare Other | Source: Ambulatory Visit | Attending: Family Medicine | Admitting: Family Medicine

## 2016-09-02 DIAGNOSIS — Z09 Encounter for follow-up examination after completed treatment for conditions other than malignant neoplasm: Secondary | ICD-10-CM

## 2017-02-26 ENCOUNTER — Other Ambulatory Visit: Payer: Self-pay | Admitting: Otolaryngology

## 2017-02-26 DIAGNOSIS — J329 Chronic sinusitis, unspecified: Secondary | ICD-10-CM

## 2017-08-20 ENCOUNTER — Telehealth: Payer: Self-pay

## 2017-08-20 NOTE — Telephone Encounter (Signed)
SENT NOTES TO SCHEDULING 

## 2017-08-27 ENCOUNTER — Telehealth: Payer: Self-pay | Admitting: Student

## 2017-08-27 NOTE — Telephone Encounter (Signed)
Received records from [facility] for appointment on 09/10/17 with Randall An, PA.  Records put with Brittany's schedule for 09/10/17. lp

## 2017-09-02 NOTE — Progress Notes (Signed)
Cardiology Office Note   Date:  09/04/2017   ID:  Alison Blake, DOB Nov 06, 1971, MRN [id number]  PCP:  Daisy Floro, MD  Cardiologist:   Rollene Rotunda, MD  Referring:  Daisy Floro, MD  Chief Complaint  Patient presents with  . Abnormal Toenails      History of Present Illness: Alison Blake is a 46 y.o. female who is referred by Daisy Floro, MD for evaluation of Bruising under her nail beds. She's not had any prior cardiac history. She's not particularly active although she has to do her activities of daily living which includes mowing a lot of vacuuming. She gets tired and short of breath with this but this doesn't seem out of proportion to the level of activity. This hasn't changed. She has lots of somatic complaints but I cannot get from her any true history of cardiac complaints such as chest pressure, neck or arm discomfort. She does feel her heart rate sometimes that she doesn't have syncope. She's not had any PND or orthopnea. She's had no documented fevers or chills though she says she's cold natured. She said she can sweat. She had some discoloration under the left great toenail for some time and she started at the same thing on the right. She had one area of a blue "knot" on her left pretibial area apparently. She doesn't describe any trauma. She does not think this is related to the sandals that she wears.  She said she became more concerned when the same pattern of discoloration of her in the right great toenail.   Past Medical History:  Diagnosis Date  . Anxiety   . Bipolar 1 disorder (HCC)   . Fibromyalgia   . IBS (irritable bowel syndrome)   . Interstitial cystitis   . Multiple personality disorder (HCC)   . Panic attacks     Past Surgical History:  Procedure Laterality Date  . ABDOMINAL HYSTERECTOMY    . APPENDECTOMY    . BLADDER SURGERY     hyperextension surgey  . BREAST LUMPECTOMY Right   . CHOLECYSTECTOMY    . laproscopy     x 7     Current Outpatient Prescriptions  Medication Sig Dispense Refill  . alprazolam (XANAX) 2 MG tablet Take 2 mg by mouth 3 (three) times daily as needed for anxiety.     Marland Kitchen amphetamine-dextroamphetamine (ADDERALL) 30 MG tablet Take 30 mg by mouth daily as needed (for hyperactivity).     . Biotin (PA BIOTIN) 1000 MCG tablet Take 1,000 mcg by mouth daily.    Marland Kitchen bismuth subsalicylate (PEPTO BISMOL) 262 MG/15ML suspension Take 30 mLs by mouth every 6 (six) hours as needed for indigestion.     Marland Kitchen estradiol (ESTRACE) 2 MG tablet Take 2 mg by mouth at bedtime.     . ondansetron (ZOFRAN ODT) 4 MG disintegrating tablet Take 1 tablet (4 mg total) by mouth every 8 (eight) hours as needed for nausea or vomiting. 20 tablet 0  . oxyCODONE-acetaminophen (PERCOCET) 10-325 MG per tablet Take 1 tablet by mouth every 4 (four) hours as needed for pain.     . ranitidine (ZANTAC) 150 MG tablet Take 300 mg by mouth 3 (three) times daily.    . SUMAtriptan (IMITREX) 100 MG tablet Take 100 mg by mouth as needed for migraine.     . vitamin B-12 (CYANOCOBALAMIN) 1000 MCG tablet Take 1,000 mcg by mouth daily.    Marland Kitchen zolpidem (AMBIEN) 10 MG tablet  Take 10 mg by mouth at bedtime as needed for sleep.     No current facility-administered medications for this visit.     Allergies:   Penicillins    Social History:  The patient  reports that she has been smoking Cigarettes.  She has never used smokeless tobacco. She reports that she drinks alcohol. She reports that she does not use drugs.   Family History:  The patient's family history includes CAD (age of onset: 44) in her father; Diabetes in her mother; Hypertension in her father and mother; Nephrolithiasis in her father, mother, and sister.    ROS:  Please see the history of present illness.   Otherwise, review of systems are positive for none.   All other systems are reviewed and negative.    PHYSICAL EXAM: VS:  BP 102/72 (BP Location: Right Arm, Patient Position:  Sitting, Cuff Size: Normal)   Pulse 62   Ht 5\' 8"  (1.727 m)   Wt 181 lb 12.8 oz (82.5 kg)   BMI 27.64 kg/m  , BMI Body mass index is 27.64 kg/m. GENERAL:  Well appearing HEENT:  Pupils equal round and reactive, fundi not visualized, oral mucosa unremarkable, No subconjunctival hemorrhages NECK:  No jugular venous distention, waveform within normal limits, carotid upstroke brisk and symmetric, no bruits, no thyromegaly LYMPHATICS:  No cervical, inguinal adenopathy LUNGS:  Clear to auscultation bilaterally BACK:  No CVA tenderness CHEST:  Unremarkable HEART:  PMI not displaced or sustained,S1 and S2 within normal limits, no S3, no S4, no clicks, no rubs, no murmurs ABD:  Flat, positive bowel sounds normal in frequency in pitch, no bruits, no rebound, no guarding, no midline pulsatile mass, no hepatomegaly, no splenomegaly EXT:  2 plus pulses throughout, no edema, no cyanosis no clubbing, wedge-shaped defect on the mid lateral aspect similar on both the right and left great toe, no Osler's nodes no Janeway lesions SKIN:  No rashes no nodules NEURO:  Cranial nerves II through XII grossly intact, motor grossly intact throughout PSYCH:  Cognitively intact, oriented to person place and time    EKG:  EKG is ordered today. The ekg ordered today demonstrates sinus rhythm, rate 62, axis within normal limits, intervals within normal limits, no acute ST-T wave changes.   Recent Labs: 09/03/2017: BUN 12; Creatinine, Ser 0.87; Potassium 4.0; Sodium 140; TSH 1.580    Lipid Panel No results found for: CHOL, TRIG, HDL, CHOLHDL, VLDL, LDLCALC, LDLDIRECT    Wt Readings from Last 3 Encounters:  09/03/17 181 lb 12.8 oz (82.5 kg)  07/06/16 180 lb (81.6 kg)  03/07/16 178 lb 2 oz (80.8 kg)      Other studies Reviewed: Additional studies/ records that were reviewed today include: Office records. Review of the above records demonstrates:  Please see elsewhere in the note.     ASSESSMENT AND  PLAN:  Subungual discoloration:  The patient had a normal sedimentation rate drawn by her primary provider. She has no indication of valvular heart disease or systemic manifestations insistent with endocarditis. I think that possibility is extremely low. She does not have any evidence of peripheral vascular disease. I do not think there is a cardiac or vascular etiology of this. It is peculiar that the pattern is a 05/07/16 of each other which would more likely suggest some mechanical process that she discuss this. I suggested possibly seeing a podiatrist and her primary provider has suggested a dermatologist. No further cardiac workup however is indicated.  Palpitations:  I did  draw a TSH and basic metabolic profile. However, these don't seem to be severe and at this point I would not think monitoring is indicated although if these persist might suggest that she purchase Alive Cor.      Current medicines are reviewed at length with the patient today.  The patient does not have concerns regarding medicines.  The following changes have been made:  no change  Labs/ tests ordered today include:   Orders Placed This Encounter  Procedures  . TSH  . Basic Metabolic Panel (BMET)  . EKG 12-Lead     Disposition:   FU with me as needed.      Signed, Rollene Rotunda, MD  09/04/2017 7:53 AM    [facility]

## 2017-09-03 ENCOUNTER — Encounter: Payer: Self-pay | Admitting: Cardiology

## 2017-09-03 ENCOUNTER — Ambulatory Visit (INDEPENDENT_AMBULATORY_CARE_PROVIDER_SITE_OTHER): Payer: Medicare Other | Admitting: Cardiology

## 2017-09-03 ENCOUNTER — Encounter (INDEPENDENT_AMBULATORY_CARE_PROVIDER_SITE_OTHER): Payer: Self-pay

## 2017-09-03 VITALS — BP 102/72 | HR 62 | Ht 68.0 in | Wt 181.8 lb

## 2017-09-03 DIAGNOSIS — R002 Palpitations: Secondary | ICD-10-CM | POA: Diagnosis not present

## 2017-09-03 DIAGNOSIS — Z79899 Other long term (current) drug therapy: Secondary | ICD-10-CM | POA: Diagnosis not present

## 2017-09-03 DIAGNOSIS — L608 Other nail disorders: Secondary | ICD-10-CM

## 2017-09-03 LAB — BASIC METABOLIC PANEL
BUN/Creatinine Ratio: 14 (ref 9–23)
BUN: 12 mg/dL (ref 6–24)
CO2: 24 mmol/L (ref 20–29)
CREATININE: 0.87 mg/dL (ref 0.57–1.00)
Calcium: 9 mg/dL (ref 8.7–10.2)
Chloride: 101 mmol/L (ref 96–106)
GFR calc Af Amer: 92 mL/min/{1.73_m2} (ref >59)
GFR calc non Af Amer: 80 mL/min/{1.73_m2} (ref >59)
GLUCOSE: 86 mg/dL (ref 65–99)
Potassium: 4 mmol/L (ref 3.5–5.2)
Sodium: 140 mmol/L (ref 134–144)

## 2017-09-03 LAB — TSH: TSH: 1.58 u[IU]/mL (ref 0.450–4.500)

## 2017-09-03 NOTE — Patient Instructions (Signed)
Medication Instructions:  Continue current medications  If you need a refill on your cardiac medications before your next appointment, please call your pharmacy.  Labwork: TSH and BMP HERE IN OUR OFFICE AT [facility]  Testing/Procedures: None Ordered  Follow-Up: Your physician wants you to follow-up in: As Needed.   Thank you for choosing [facility] at [facility]!!

## 2017-09-04 ENCOUNTER — Encounter: Payer: Self-pay | Admitting: Cardiology

## 2017-09-04 DIAGNOSIS — R002 Palpitations: Secondary | ICD-10-CM | POA: Insufficient documentation

## 2017-09-04 DIAGNOSIS — L608 Other nail disorders: Secondary | ICD-10-CM | POA: Insufficient documentation

## 2017-09-09 ENCOUNTER — Telehealth: Payer: Self-pay | Admitting: *Deleted

## 2017-09-09 ENCOUNTER — Encounter: Payer: Self-pay | Admitting: Podiatry

## 2017-09-09 ENCOUNTER — Ambulatory Visit (INDEPENDENT_AMBULATORY_CARE_PROVIDER_SITE_OTHER): Payer: Medicare Other | Admitting: Podiatry

## 2017-09-09 VITALS — BP 116/71 | HR 61 | Resp 18

## 2017-09-09 DIAGNOSIS — M792 Neuralgia and neuritis, unspecified: Secondary | ICD-10-CM

## 2017-09-09 DIAGNOSIS — L608 Other nail disorders: Secondary | ICD-10-CM | POA: Diagnosis not present

## 2017-09-09 DIAGNOSIS — S90229A Contusion of unspecified lesser toe(s) with damage to nail, initial encounter: Secondary | ICD-10-CM

## 2017-09-09 NOTE — Addendum Note (Signed)
Addended by: Hadley Pen R on: 09/09/2017 04:39 PM   Modules accepted: Orders

## 2017-09-09 NOTE — Telephone Encounter (Addendum)
-----   Message from Vivi Barrack, DPM sent at 09/09/2017  2:31 PM EDT ----- Can you please order a NCV for her due to burning pain and coldness to her feet? Thanks. Faxed orders to [facility].

## 2017-09-09 NOTE — Progress Notes (Signed)
   Subjective:    Patient ID: Alison Blake, female    DOB: December 14, 1970, 46 y.o.   MRN: [id number]  HPI  46 year old female presents the office they for concerns of discoloration to both of her big toenails. She said that she's had discoloration of the nails of the last couple years but she startedand worsening in August of this year. She presents on her pain management doctor as well as her primary care physician who she states that the very low back melanoma. Given concern for possible valve issues she was referred to cardiology who is referred here for the toenail discoloration. She states the toenails occasionally due to get discolored. She has bruises quite easily. She also states that she gets coldness to her feet as well as burning pain to her legs at times. She does see pain management doctor for her back although she states that she has upper back pain. She denies any claudication symptoms. She has no other concerns today.   Review of Systems  All other systems reviewed and are negative.      Objective:   Physical Exam  General: AAO x3, NAD  Dermatological:   bilateral hallux toenails have evidence of darkened discoloration of the nails mostly on the lateral aspects with the left side worse than the right. There is no pain of the nail there is no cellulitis or drainage. There is no extension of any hyperpigmentation into the surrounding skin. There he appears to be subungual hematoma. I was able to debris the nails which I sent for biopsy, see plan, was able to remove some of the subungual hematoma. There is no open lesions of infected.   Vascular: Dorsalis Pedis artery and Posterior Tibial artery pedal pulses are 2/4 bilateral with immedate capillary fill time. Pedal hair growth present. No varicosities and no lower extremity edema present bilateral. There is no pain with calf compression, swelling, warmth, erythema. No claudication symptoms  Neruologic: Sensation mildly decreased  with Simms Weinstein monofilament to the plantar forefoot. Otherwise sensation intact with Dorann Ou monofilament.  Musculoskeletal: No gross boney pedal deformities bilateral. No pain, crepitus, or limitation noted with foot and ankle range of motion bilateral. Muscular strength 5/5 in all groups tested bilateral.  Gait: Unassisted, Nonantalgic.      Assessment & Plan:  46 year old female with bilateral nail discoloration likely subungual hematoma ; neuritis symptoms bilateral lower extremities  -Treatment options discussed including all alternatives, risks, and complications -Etiology of symptoms were discussed -I sharply debrided both of the hallux toenails today and I symptoms for biopsy and separate specimens. The specimens were given to  Hadley Pen, CMA and we will send them to [facility] to rule out melanoma, although unlikely she declined having the nails removed today.  -Given the burning pain to her legs as well as the coldness think this is more neurological in origin given that her circulation appears to be adequate. Order nerve conduction test and she agrees this.  -Follow-up after nail biopsy or sooner if any issues are to arise. Encouraged to call with any questions or concerns.   Ovid Curd, DPM

## 2017-09-10 ENCOUNTER — Ambulatory Visit: Payer: Medicare Other | Admitting: Student

## 2017-09-23 ENCOUNTER — Other Ambulatory Visit: Payer: Self-pay | Admitting: Family Medicine

## 2017-09-23 DIAGNOSIS — Z1231 Encounter for screening mammogram for malignant neoplasm of breast: Secondary | ICD-10-CM

## 2017-09-25 ENCOUNTER — Telehealth: Payer: Self-pay | Admitting: *Deleted

## 2017-09-25 NOTE — Telephone Encounter (Addendum)
-----   Message from Vivi Barrack, DPM sent at 09/24/2017  7:19 AM EDT ----- Biopsy did not show any evidence of malignancy and that it is blood and injury to the nail. Please let her know. Thanks.09/25/2017-I informed pt of Dr. Gabriel Rung review of results. Pt states she hasn't had an injury. I explained that improper fitting shoes or sports may cause the toenail to tap or rub on the shoe and cause bleeding or bruising to the nail bed. Pt states she wears sandals. I told pt if she would like to discuss with Dr. Ardelle Anton she could make another appt.

## 2017-10-14 ENCOUNTER — Ambulatory Visit
Admission: RE | Admit: 2017-10-14 | Discharge: 2017-10-14 | Disposition: A | Discharge status: Home / Self Care | Payer: Medicare Other | Source: Ambulatory Visit | Attending: Family Medicine | Admitting: Family Medicine

## 2017-10-14 DIAGNOSIS — Z1231 Encounter for screening mammogram for malignant neoplasm of breast: Secondary | ICD-10-CM

## 2019-09-07 ENCOUNTER — Other Ambulatory Visit: Payer: Self-pay

## 2019-09-07 DIAGNOSIS — Z20822 Contact with and (suspected) exposure to covid-19: Secondary | ICD-10-CM

## 2019-09-09 ENCOUNTER — Telehealth: Payer: Self-pay | Admitting: Hematology

## 2019-09-09 ENCOUNTER — Other Ambulatory Visit: Payer: Self-pay | Admitting: Family Medicine

## 2019-09-09 DIAGNOSIS — N644 Mastodynia: Secondary | ICD-10-CM

## 2019-09-09 LAB — NOVEL CORONAVIRUS, NAA: SARS-CoV-2, NAA: NOT DETECTED

## 2019-09-09 NOTE — Telephone Encounter (Signed)
Pt is aware covid 19 test is negative °

## 2019-09-14 ENCOUNTER — Other Ambulatory Visit: Payer: Self-pay | Admitting: Family Medicine

## 2019-09-14 DIAGNOSIS — N644 Mastodynia: Secondary | ICD-10-CM

## 2019-09-20 ENCOUNTER — Other Ambulatory Visit: Payer: Self-pay

## 2019-09-20 ENCOUNTER — Ambulatory Visit: Payer: Medicare Other

## 2019-09-20 ENCOUNTER — Ambulatory Visit
Admission: RE | Admit: 2019-09-20 | Discharge: 2019-09-20 | Disposition: A | Discharge status: Home / Self Care | Payer: Medicare Other | Source: Ambulatory Visit | Attending: Family Medicine | Admitting: Family Medicine

## 2019-09-20 DIAGNOSIS — N644 Mastodynia: Secondary | ICD-10-CM

## 2019-10-12 ENCOUNTER — Encounter (HOSPITAL_COMMUNITY): Payer: Self-pay | Admitting: Emergency Medicine

## 2019-10-12 ENCOUNTER — Emergency Department (HOSPITAL_COMMUNITY): Payer: Medicare Other

## 2019-10-12 ENCOUNTER — Other Ambulatory Visit: Payer: Self-pay

## 2019-10-12 ENCOUNTER — Emergency Department (HOSPITAL_COMMUNITY)
Admission: EM | Admit: 2019-10-12 | Discharge: 2019-10-12 | Disposition: A | Discharge status: Home / Self Care | Payer: Medicare Other | Attending: Emergency Medicine | Admitting: Emergency Medicine

## 2019-10-12 DIAGNOSIS — R1110 Vomiting, unspecified: Secondary | ICD-10-CM | POA: Diagnosis not present

## 2019-10-12 DIAGNOSIS — R1012 Left upper quadrant pain: Secondary | ICD-10-CM | POA: Diagnosis present

## 2019-10-12 DIAGNOSIS — Z79899 Other long term (current) drug therapy: Secondary | ICD-10-CM | POA: Insufficient documentation

## 2019-10-12 DIAGNOSIS — R141 Gas pain: Secondary | ICD-10-CM

## 2019-10-12 DIAGNOSIS — R142 Eructation: Secondary | ICD-10-CM | POA: Diagnosis not present

## 2019-10-12 DIAGNOSIS — K581 Irritable bowel syndrome with constipation: Secondary | ICD-10-CM

## 2019-10-12 DIAGNOSIS — K5900 Constipation, unspecified: Secondary | ICD-10-CM

## 2019-10-12 DIAGNOSIS — F17210 Nicotine dependence, cigarettes, uncomplicated: Secondary | ICD-10-CM | POA: Diagnosis not present

## 2019-10-12 LAB — CBC WITH DIFFERENTIAL/PLATELET
Abs Immature Granulocytes: 0.02 10*3/uL (ref 0.00–0.07)
Basophils Absolute: 0 10*3/uL (ref 0.0–0.1)
Basophils Relative: 0 %
Eosinophils Absolute: 0 10*3/uL (ref 0.0–0.5)
Eosinophils Relative: 1 %
HCT: 41.3 % (ref 36.0–46.0)
Hemoglobin: 14 g/dL (ref 12.0–15.0)
Immature Granulocytes: 0 %
Lymphocytes Relative: 12 %
Lymphs Abs: 1 10*3/uL (ref 0.7–4.0)
MCH: 32.6 pg (ref 26.0–34.0)
MCHC: 33.9 g/dL (ref 30.0–36.0)
MCV: 96.3 fL (ref 80.0–100.0)
Monocytes Absolute: 0.5 10*3/uL (ref 0.1–1.0)
Monocytes Relative: 6 %
Neutro Abs: 7.2 10*3/uL (ref 1.7–7.7)
Neutrophils Relative %: 81 %
Platelets: 226 10*3/uL (ref 150–400)
RBC: 4.29 MIL/uL (ref 3.87–5.11)
RDW: 12.4 % (ref 11.5–15.5)
WBC: 8.8 10*3/uL (ref 4.0–10.5)
nRBC: 0 % (ref 0.0–0.2)

## 2019-10-12 LAB — I-STAT BETA HCG BLOOD, ED (MC, WL, AP ONLY): I-stat hCG, quantitative: <5 m[IU]/mL (ref <5)

## 2019-10-12 LAB — COMPREHENSIVE METABOLIC PANEL
ALT: 23 U/L (ref 0–44)
AST: 25 U/L (ref 15–41)
Albumin: 4.1 g/dL (ref 3.5–5.0)
Alkaline Phosphatase: 70 U/L (ref 38–126)
Anion gap: 10 (ref 5–15)
BUN: 15 mg/dL (ref 6–20)
CO2: 27 mmol/L (ref 22–32)
Calcium: 9.2 mg/dL (ref 8.9–10.3)
Chloride: 101 mmol/L (ref 98–111)
Creatinine, Ser: 0.88 mg/dL (ref 0.44–1.00)
GFR calc Af Amer: >60 mL/min (ref >60)
GFR calc non Af Amer: >60 mL/min (ref >60)
Glucose, Bld: 103 mg/dL — ABNORMAL HIGH (ref 70–99)
Potassium: 3.9 mmol/L (ref 3.5–5.1)
Sodium: 138 mmol/L (ref 135–145)
Total Bilirubin: 0.9 mg/dL (ref 0.3–1.2)
Total Protein: 7.3 g/dL (ref 6.5–8.1)

## 2019-10-12 MED ORDER — KETOROLAC TROMETHAMINE 30 MG/ML IJ SOLN
15.0000 mg | Freq: Once | INTRAMUSCULAR | Status: AC
  Administered 2019-10-12: 06:00:00 15 mg via INTRAVENOUS
  Filled 2019-10-12: qty 1

## 2019-10-12 MED ORDER — LIDOCAINE VISCOUS HCL 2 % MT SOLN
15.0000 mL | Freq: Once | OROMUCOSAL | Status: AC
  Administered 2019-10-12: 05:00:00 15 mL via ORAL
  Filled 2019-10-12: qty 15

## 2019-10-12 MED ORDER — ALUM & MAG HYDROXIDE-SIMETH 200-200-20 MG/5ML PO SUSP
30.0000 mL | Freq: Once | ORAL | Status: AC
  Administered 2019-10-12: 05:00:00 30 mL via ORAL
  Filled 2019-10-12: qty 30

## 2019-10-12 MED ORDER — POLYETHYLENE GLYCOL 3350 17 G PO PACK: 17.0000 g | PACK | Freq: Two times a day (BID) | ORAL | 0 refills | Status: DC

## 2019-10-12 MED ORDER — DICYCLOMINE HCL 20 MG PO TABS: 20.0000 mg | ORAL_TABLET | Freq: Two times a day (BID) | ORAL | 0 refills | Status: AC

## 2019-10-12 NOTE — ED Provider Notes (Signed)
[place] DEPT Provider Note   CSN: [id number] Arrival date & time: 10/12/19  0416     History   Chief Complaint Chief Complaint  Patient presents with  . Abdominal Pain  . Emesis    HPI Alison Blake is a 48 y.o. female.     The history is provided by the patient.  Abdominal Pain Pain location:  LUQ Pain quality: cramping   Pain radiates to:  Does not radiate Pain severity:  Moderate Onset quality:  Gradual Duration:  6 hours Timing:  Constant Progression:  Waxing and waning Chronicity:  New Context: eating   Context: not diet changes and not laxative use   Relieved by:  Nothing Worsened by:  Nothing Ineffective treatments:  None tried Associated symptoms: belching, constipation and vomiting   Associated symptoms: no anorexia, no chest pain, no chills, no cough, no diarrhea, no dysuria and no shortness of breath   Risk factors: no alcohol abuse and not pregnant   Emesis Associated symptoms: abdominal pain   Associated symptoms: no chills, no cough and no diarrhea   Gassy   Past Medical History:  Diagnosis Date  . Anxiety   . Bipolar 1 disorder ([place])   . Fibromyalgia   . IBS (irritable bowel syndrome)   . Interstitial cystitis   . Multiple personality disorder ([place])   . Panic attacks     Patient Active Problem List   Diagnosis Date Noted  . Palpitations 09/04/2017  . Toenail deformity 09/04/2017    Past Surgical History:  Procedure Laterality Date  . ABDOMINAL HYSTERECTOMY    . APPENDECTOMY    . BLADDER SURGERY     hyperextension surgey  . BREAST LUMPECTOMY Right    benign  . CHOLECYSTECTOMY    . laproscopy     x 7     OB History   No obstetric history on file.      Home Medications    Prior to Admission medications   Medication Sig Start Date End Date Taking? Authorizing Provider  alprazolam Duanne Moron) 2 MG tablet Take 2 mg by mouth 3 (three) times daily as needed for anxiety.     [provider]  amphetamine-dextroamphetamine (ADDERALL) 30 MG tablet Take 30 mg by mouth daily as needed (for hyperactivity).     [provider]  Biotin (PA BIOTIN) 1000 MCG tablet Take 1,000 mcg by mouth daily.    [provider]  bismuth subsalicylate (PEPTO BISMOL) 262 MG/15ML suspension Take 30 mLs by mouth every 6 (six) hours as needed for indigestion.     [provider]  estradiol (ESTRACE) 2 MG tablet Take 2 mg by mouth at bedtime.     [provider]  ondansetron (ZOFRAN ODT) 4 MG disintegrating tablet Take 1 tablet (4 mg total) by mouth every 8 (eight) hours as needed for nausea or vomiting. 07/06/16   Gareth Morgan, MD  oxyCODONE-acetaminophen (PERCOCET) 10-325 MG per tablet Take 1 tablet by mouth every 4 (four) hours as needed for pain.     [provider]  ranitidine (ZANTAC) 150 MG tablet Take 300 mg by mouth 3 (three) times daily.    [provider]  SUMAtriptan (IMITREX) 100 MG tablet Take 100 mg by mouth as needed for migraine.     [provider]  vitamin B-12 (CYANOCOBALAMIN) 1000 MCG tablet Take 1,000 mcg by mouth daily.    [provider]  zolpidem (AMBIEN) 10 MG tablet Take 10 mg by mouth  at bedtime as needed for sleep.    [provider]    Family History Family History  Problem Relation Age of Onset  . Hypertension Mother   . Diabetes Mother   . Nephrolithiasis Mother   . CAD Father [id number]  . Hypertension Father   . Nephrolithiasis Father   . Nephrolithiasis Sister     Social History Social History   Tobacco Use  . Smoking status: Current Some Day Smoker    Types: Cigarettes  . Smokeless tobacco: Never Used  Substance Use Topics  . Alcohol use: Yes    Alcohol/week: 0.0 standard drinks    Comment: social  . Drug use: No     Allergies   Penicillins   Review of Systems Review of Systems  Constitutional: Negative for chills.  Respiratory: Negative for cough and shortness  of breath.   Cardiovascular: Negative for chest pain.  Gastrointestinal: Positive for abdominal pain, constipation and vomiting. Negative for anorexia and diarrhea.  Genitourinary: Negative for dysuria.  All other systems reviewed and are negative.    Physical Exam Updated Vital Signs BP 127/81 (BP Location: Right Arm)   Pulse 72   Resp 17   SpO2 97%   Physical Exam Vitals signs and nursing note reviewed.  Constitutional:      General: She is not in acute distress.    Appearance: Normal appearance.  HENT:     Head: Normocephalic and atraumatic.     Nose: Nose normal.  Eyes:     Conjunctiva/sclera: Conjunctivae normal.     Pupils: Pupils are equal, round, and reactive to light.  Neck:     Musculoskeletal: Normal range of motion and neck supple.  Cardiovascular:     Rate and Rhythm: Normal rate and regular rhythm.     Pulses: Normal pulses.     Heart sounds: Normal heart sounds.  Pulmonary:     Effort: Pulmonary effort is normal.     Breath sounds: Normal breath sounds.  Abdominal:     General: Abdomen is flat.     Tenderness: There is no abdominal tenderness. There is no guarding or rebound.     Comments: Gassy throughout  Musculoskeletal: Normal range of motion.  Skin:    General: Skin is warm and dry.     Capillary Refill: Capillary refill takes less than 2 seconds.  Neurological:     General: No focal deficit present.     Mental Status: She is alert and oriented to person, place, and time.  Psychiatric:        Mood and Affect: Mood normal.        Behavior: Behavior normal.      ED Treatments / Results  Labs (all labs ordered are listed, but only abnormal results are displayed) Labs Reviewed  COMPREHENSIVE METABOLIC PANEL - Abnormal; Notable for the following components:      Result Value   Glucose, Bld 103 (*)    All other components within normal limits  CBC WITH DIFFERENTIAL/PLATELET  I-STAT BETA HCG BLOOD, ED ([facility], [facility], [facility] ONLY)    EKG None   Radiology No results found.  Procedures Procedures (including critical care time)  Medications Ordered in ED Medications  alum & mag hydroxide-simeth (MAALOX/MYLANTA) 200-200-20 MG/5ML suspension 30 mL (30 mLs Oral Given 10/12/19 0521)    And  lidocaine (XYLOCAINE) 2 % viscous mouth solution 15 mL (15 mLs Oral Given 10/12/19 0521)  ketorolac (TORADOL) 30 MG/ML injection 15 mg (15 mg Intravenous Given 10/12/19 0614)  Initial Impression / Assessment and Plan / ED Course  Symptoms consistent with gas pain and constipation likely secondary to IBS.  Ruled out for obstruction and biliary disease. Will start miralax and high fiber diet.  Follow up with you GI specialist.   Sena Slate was evaluated in Emergency Department on 10/12/2019 for the symptoms described in the history of present illness. She was evaluated in the context of the global COVID-19 pandemic, which necessitated consideration that the patient might be at risk for infection with the SARS-CoV-2 virus that causes COVID-19. Institutional protocols and algorithms that pertain to the evaluation of patients at risk for COVID-19 are in a state of rapid change based on information released by regulatory bodies including the CDC and federal and state organizations. These policies and algorithms were followed during the patient's care in the ED.  Final Clinical Impressions(s) / ED Diagnoses   Final diagnoses:  Constipation, unspecified constipation type  Irritable bowel syndrome with constipation  Gas pain    Return for intractable cough, coughing up blood,fevers >100.4 unrelieved by medication, shortness of breath, intractable vomiting, chest pain, shortness of breath, weakness,numbness, changes in speech, facial asymmetry,abdominal pain, passing out,Inability to tolerate liquids or food, cough, altered mental status or any concerns. No signs of systemic illness or infection. The patient is nontoxic-appearing on exam  and vital signs are within normal limits.   I have reviewed the triage vital signs and the nursing notes. Pertinent labs &imaging results that were available during my care of the patient were reviewed by me and considered in my medical decision making (see chart for details).  After history, exam, and medical workup I feel the patient has been appropriately medically screened and is safe for discharge home. Pertinent diagnoses were discussed with the patient. Patient was given return precautions   Helix Lafontaine, MD 10/12/19 [phone]

## 2019-10-12 NOTE — ED Triage Notes (Signed)
Per EMS: Pt is coming from home with c/o abdominal pain after eating at 2000. Pt had a feeling of fullness and has diffuse abdominal pain in LUQ and RUQ. Pt has N/V and is dry heaving. No SOB and LBM was yesterday. Hx of IBS.   EMS VITALS: BP 122/84 HR 80 RR 24 SPO2 100%  EMS MEDS: 4mg  zofran

## 2019-10-12 NOTE — ED Notes (Signed)
Pt was verbalized discharge instructions. Pt had no further questions at this time. NAD. 

## 2019-11-22 ENCOUNTER — Ambulatory Visit: Payer: Medicare Other | Attending: Internal Medicine

## 2019-11-22 DIAGNOSIS — Z20822 Contact with and (suspected) exposure to covid-19: Secondary | ICD-10-CM

## 2019-11-24 LAB — NOVEL CORONAVIRUS, NAA: SARS-CoV-2, NAA: NOT DETECTED

## 2020-02-27 ENCOUNTER — Ambulatory Visit: Payer: Medicare Other | Attending: Internal Medicine

## 2020-02-27 DIAGNOSIS — Z23 Encounter for immunization: Secondary | ICD-10-CM

## 2020-02-27 NOTE — Progress Notes (Signed)
   Covid-19 Vaccination Clinic  Name:  Alison Blake    MRN: [id number] DOB: [birth year]  02/27/2020  [name] was observed post Covid-19 immunization for 15 minutes without incident. She was provided with Vaccine Information Sheet and instruction to access the V-Safe system.   [name] was instructed to call 911 with any severe reactions post vaccine: Marland Kitchen Difficulty breathing  . Swelling of face and throat  . A fast heartbeat  . A bad rash all over body  . Dizziness and weakness   Immunizations Administered    Name Date Dose VIS Date Route   Pfizer COVID-19 Vaccine 02/27/2020  3:27 PM 0.3 mL 11/11/2019 Intramuscular   Manufacturer: [employer], [employer]   Lot: [id number]   NDC: [address]-2

## 2020-03-21 ENCOUNTER — Ambulatory Visit: Payer: Medicare Other | Attending: Internal Medicine

## 2020-03-21 DIAGNOSIS — Z23 Encounter for immunization: Secondary | ICD-10-CM

## 2020-03-21 NOTE — Progress Notes (Signed)
   Covid-19 Vaccination Clinic  Name:  Alison Blake    MRN: [id number] DOB: 18-Feb-1971  03/21/2020  [name] was observed post Covid-19 immunization for 30 minutes based on pre-vaccination screening without incident. She was provided with Vaccine Information Sheet and instruction to access the V-Safe system.   [name] was instructed to call 911 with any severe reactions post vaccine: Marland Kitchen Difficulty breathing  . Swelling of face and throat  . A fast heartbeat  . A bad rash all over body  . Dizziness and weakness   Immunizations Administered    Name Date Dose VIS Date Route   Pfizer COVID-19 Vaccine 03/21/2020  2:51 PM 0.3 mL 01/25/2019 Intramuscular   Manufacturer: [employer], [employer]   Lot: [id number]   NDC: [address]-8

## 2020-05-08 ENCOUNTER — Ambulatory Visit: Payer: Medicare Other | Admitting: Sports Medicine

## 2020-05-08 ENCOUNTER — Ambulatory Visit: Payer: Medicare Other

## 2020-05-08 ENCOUNTER — Other Ambulatory Visit: Payer: Self-pay | Admitting: Sports Medicine

## 2020-05-08 ENCOUNTER — Ambulatory Visit (INDEPENDENT_AMBULATORY_CARE_PROVIDER_SITE_OTHER): Payer: Medicare Other

## 2020-05-08 ENCOUNTER — Other Ambulatory Visit: Payer: Self-pay

## 2020-05-08 ENCOUNTER — Encounter: Payer: Self-pay | Admitting: Sports Medicine

## 2020-05-08 VITALS — Temp 97.5°F

## 2020-05-08 DIAGNOSIS — M79673 Pain in unspecified foot: Secondary | ICD-10-CM | POA: Diagnosis not present

## 2020-05-08 DIAGNOSIS — M722 Plantar fascial fibromatosis: Secondary | ICD-10-CM | POA: Diagnosis not present

## 2020-05-08 DIAGNOSIS — M79672 Pain in left foot: Secondary | ICD-10-CM

## 2020-05-08 DIAGNOSIS — I739 Peripheral vascular disease, unspecified: Secondary | ICD-10-CM | POA: Diagnosis not present

## 2020-05-08 DIAGNOSIS — M79671 Pain in right foot: Secondary | ICD-10-CM

## 2020-05-08 MED ORDER — TRIAMCINOLONE ACETONIDE 10 MG/ML IJ SUSP
10.0000 mg | Freq: Once | INTRAMUSCULAR | Status: AC
  Administered 2020-05-08: 10 mg

## 2020-05-08 NOTE — Patient Instructions (Signed)

## 2020-05-08 NOTE — Progress Notes (Signed)
Subjective: Alison Blake is a 49 y.o. female patient presents to office with complaint of moderate heel pain on both the left and right that radiates to the ball of the foot reports that her pain is so bad some days she can't even walk reports that it is getting worse with some swelling and notices that her feet sometimes turn a purplish blue color and tend to stay swollen reports that pain has been gradually getting worse over the last year on oxycodone for other chronic pain issues and reports that that is not even giving her any relief for her foot pain.  Patient denies any injury or trauma or any other pedal complaints at this time.  Review of Systems  All other systems reviewed and are negative.    Patient Active Problem List   Diagnosis Date Noted  . Palpitations 09/04/2017  . Toenail deformity 09/04/2017    Current Outpatient Medications on File Prior to Visit  Medication Sig Dispense Refill  . alprazolam (XANAX) 2 MG tablet Take 2 mg by mouth 3 (three) times daily as needed for anxiety.     Marland Kitchen amphetamine-dextroamphetamine (ADDERALL) 30 MG tablet Take 30 mg by mouth daily as needed (for hyperactivity).     . Biotin (PA BIOTIN) 1000 MCG tablet Take 1,000 mcg by mouth daily.    Marland Kitchen bismuth subsalicylate (PEPTO BISMOL) 262 MG/15ML suspension Take 30 mLs by mouth every 6 (six) hours as needed for indigestion.     . carbamazepine (TEGRETOL) 200 MG tablet Take 800 mg by mouth at bedtime.    . dicyclomine (BENTYL) 20 MG tablet Take 1 tablet (20 mg total) by mouth 2 (two) times daily. 20 tablet 0  . estradiol (ESTRACE) 2 MG tablet Take 2 mg by mouth at bedtime.     . famotidine (PEPCID) 20 MG tablet Take 20 mg by mouth at bedtime as needed.    . fluticasone (FLONASE) 50 MCG/ACT nasal spray USE 1 2 SPRAY IN EACH NOSTRIL TWICE A DAY    . ondansetron (ZOFRAN ODT) 4 MG disintegrating tablet Take 1 tablet (4 mg total) by mouth every 8 (eight) hours as needed for nausea or vomiting. 20 tablet 0   . oxyCODONE-acetaminophen (PERCOCET) 10-325 MG per tablet Take 1 tablet by mouth every 4 (four) hours as needed for pain.     . pantoprazole (PROTONIX) 40 MG tablet Take 40 mg by mouth daily.    . polyethylene glycol (MIRALAX) 17 g packet Take 17 g by mouth 2 (two) times daily. 28 each 0  . QUEtiapine (SEROQUEL XR) 400 MG 24 hr tablet     . ranitidine (ZANTAC) 150 MG tablet Take 300 mg by mouth 3 (three) times daily.    . SUMAtriptan (IMITREX) 100 MG tablet Take 100 mg by mouth as needed for migraine.     . vitamin B-12 (CYANOCOBALAMIN) 1000 MCG tablet Take 1,000 mcg by mouth daily.    Marland Kitchen zolpidem (AMBIEN) 10 MG tablet Take 10 mg by mouth at bedtime as needed for sleep.     No current facility-administered medications on file prior to visit.    Allergies  Allergen Reactions  . Penicillins Hives and Other (See Comments)    Has patient had a PCN reaction causing immediate rash, facial/tongue/throat swelling, SOB or lightheadedness with hypotension: No Has patient had a PCN reaction causing severe rash involving mucus membranes or skin necrosis: No Has patient had a PCN reaction that required hospitalization No Has patient had a PCN reaction  occurring within the last 10 years: No If all of the above answers are "NO", then may proceed with Cephalosporin use.    Objective: Physical Exam General: The patient is alert and oriented x3 in no acute distress.  Dermatology: Skin is slightly cool, dry, and supple bilateral lower extremities. Nails 1-10 are normal. There is no erythema, edema, no eccymosis, no open lesions present. Integument is otherwise unremarkable.  Vascular: Dorsalis Pedis pulse and Posterior Tibial pulse are 1/4 bilateral. Capillary fill time is 5 to all digits.  Minimal purple discoloration to feet.  Neurological: Grossly intact to light touch bilateral.   Musculoskeletal: Tenderness to palpation at the medial calcaneal tubercale and through the insertion of the plantar  fascia on the left and right foot that extends into the arch and to the ball the foot making the entire plantar surface is very tender to touch with pain as well at the Achilles insertion bilateral right greater than left.  Guarded range of motion bilateral due to pain.  Gait: Unassisted  Xray, Right/Left foot:  Normal osseous mineralization. Joint spaces preserved. No fracture/dislocation/boney destruction.  Minimal calcaneal spur present with mild thickening of plantar fascia and Achilles, Kager triangle intact. No other soft tissue abnormalities or radiopaque foreign bodies.   Assessment and Plan: Problem List Items Addressed This Visit    None    Visit Diagnoses    Pain in both feet    -  Primary   Relevant Orders   DG Foot 2 Views Right   DG Foot 2 Views Left   Plantar fasciitis, bilateral       Heel pain, bilateral       Inflammatory heel pain, unspecified laterality       PVD (peripheral vascular disease) ([place])          -Complete examination performed.  -Xrays reviewed -Discussed with patient in detail the condition of plantar fasciitis with likely compensation tendinitis and severe inflammatory heel pain, how this occurs and general treatment options. Explained both conservative and surgical treatments.  -After oral consent and aseptic prep, injected a mixture containing 1 ml of 2%  plain lidocaine, 1 ml 0.5% plain marcaine, 0.5 ml of kenalog 10 and 0.5 ml of dexamethasone phosphate into left and right heel to patient's tolerance. Post-injection care discussed with patient.  -Recommended good supportive shoes and advised use of heel lifts as dispensed this visit -Explained and dispensed to patient daily stretching exercises. -Recommend patient to ice affected area 1-2x daily. -Ordered Venous studies to evaluate swelling and discoloration to toes and swelling meanwhile to use Surgitube compression sleeve to assist with edema control -Patient to return to office in 4 weeks for  follow up or sooner if problems or questions arise.  Landis Martins, DPM

## 2020-05-10 ENCOUNTER — Telehealth: Payer: Self-pay | Admitting: *Deleted

## 2020-05-10 DIAGNOSIS — R609 Edema, unspecified: Secondary | ICD-10-CM

## 2020-05-10 DIAGNOSIS — I739 Peripheral vascular disease, unspecified: Secondary | ICD-10-CM

## 2020-05-10 NOTE — Telephone Encounter (Signed)
-----   Message from Asencion Islam, [place] sent at 05/08/2020  8:00 PM EDT ----- Regarding: Venous studies Venous reflux studies Evaluate swelling bilateral lower extremities

## 2020-05-10 NOTE — Telephone Encounter (Signed)
Faxed orders to [facility]. 

## 2020-05-28 ENCOUNTER — Other Ambulatory Visit: Payer: Self-pay

## 2020-05-28 ENCOUNTER — Ambulatory Visit (HOSPITAL_COMMUNITY)
Admission: RE | Admit: 2020-05-28 | Discharge: 2020-05-28 | Disposition: A | Discharge status: Home / Self Care | Payer: Medicare Other | Source: Ambulatory Visit | Attending: Cardiology | Admitting: Cardiology

## 2020-05-28 DIAGNOSIS — R609 Edema, unspecified: Secondary | ICD-10-CM | POA: Diagnosis not present

## 2020-05-28 DIAGNOSIS — I739 Peripheral vascular disease, unspecified: Secondary | ICD-10-CM | POA: Insufficient documentation

## 2020-06-07 ENCOUNTER — Other Ambulatory Visit: Payer: Self-pay | Admitting: Anesthesiology

## 2020-06-07 ENCOUNTER — Ambulatory Visit
Admission: RE | Admit: 2020-06-07 | Discharge: 2020-06-07 | Disposition: A | Discharge status: Home / Self Care | Payer: Medicare Other | Source: Ambulatory Visit | Attending: Anesthesiology | Admitting: Anesthesiology

## 2020-06-07 DIAGNOSIS — M25551 Pain in right hip: Secondary | ICD-10-CM

## 2020-06-19 ENCOUNTER — Ambulatory Visit: Payer: Medicare Other | Admitting: Sports Medicine

## 2020-06-19 ENCOUNTER — Other Ambulatory Visit: Payer: Self-pay

## 2020-06-19 ENCOUNTER — Telehealth: Payer: Self-pay | Admitting: *Deleted

## 2020-06-19 ENCOUNTER — Encounter: Payer: Self-pay | Admitting: Sports Medicine

## 2020-06-19 DIAGNOSIS — R609 Edema, unspecified: Secondary | ICD-10-CM

## 2020-06-19 DIAGNOSIS — I739 Peripheral vascular disease, unspecified: Secondary | ICD-10-CM

## 2020-06-19 DIAGNOSIS — M722 Plantar fascial fibromatosis: Secondary | ICD-10-CM

## 2020-06-19 DIAGNOSIS — M79671 Pain in right foot: Secondary | ICD-10-CM | POA: Diagnosis not present

## 2020-06-19 DIAGNOSIS — M069 Rheumatoid arthritis, unspecified: Secondary | ICD-10-CM

## 2020-06-19 DIAGNOSIS — M79672 Pain in left foot: Secondary | ICD-10-CM

## 2020-06-19 DIAGNOSIS — M79673 Pain in unspecified foot: Secondary | ICD-10-CM | POA: Diagnosis not present

## 2020-06-19 NOTE — Telephone Encounter (Signed)
-----   Message from Asencion Islam, [place] sent at 06/19/2020  9:10 AM EDT ----- Regarding: Refer to [facility] V[facility] Reflux on right at thigh on studies Please see patient for swelling in legs worse on right Patient prefers female provider -Dr. Kathie Rhodes

## 2020-06-19 NOTE — Progress Notes (Signed)
Subjective: KENEISHA HECKART is a 49 y.o. female patient returns to office for follow up eval of bilateral foot pain and swelling. Patient reports that injections last visit did not help. Reports that she has pain all over especially at heels back>bottom and to joints including hip, back, elbow with a rash Reports that her pain doctor told her to mention these other pains to me. Patient reports that pain is the same and swelling, worse on right still with some pain in the thigh. Patient denies any other issues at this time.    Patient Active Problem List   Diagnosis Date Noted  . Palpitations 09/04/2017  . Toenail deformity 09/04/2017    Current Outpatient Medications on File Prior to Visit  Medication Sig Dispense Refill  . alprazolam (XANAX) 2 MG tablet Take 2 mg by mouth 3 (three) times daily as needed for anxiety.     Marland Kitchen amphetamine-dextroamphetamine (ADDERALL) 30 MG tablet Take 30 mg by mouth daily as needed (for hyperactivity).     . Biotin (PA BIOTIN) 1000 MCG tablet Take 1,000 mcg by mouth daily.    Marland Kitchen bismuth subsalicylate (PEPTO BISMOL) 262 MG/15ML suspension Take 30 mLs by mouth every 6 (six) hours as needed for indigestion.     . carbamazepine (TEGRETOL) 200 MG tablet Take 800 mg by mouth at bedtime.    . dicyclomine (BENTYL) 20 MG tablet Take 1 tablet (20 mg total) by mouth 2 (two) times daily. 20 tablet 0  . estradiol (ESTRACE) 2 MG tablet Take 2 mg by mouth at bedtime.     . famotidine (PEPCID) 20 MG tablet Take 20 mg by mouth at bedtime as needed.    . fluticasone (FLONASE) 50 MCG/ACT nasal spray USE 1 2 SPRAY IN EACH NOSTRIL TWICE A DAY    . ondansetron (ZOFRAN ODT) 4 MG disintegrating tablet Take 1 tablet (4 mg total) by mouth every 8 (eight) hours as needed for nausea or vomiting. 20 tablet 0  . oxyCODONE-acetaminophen (PERCOCET) 10-325 MG per tablet Take 1 tablet by mouth every 4 (four) hours as needed for pain.     . pantoprazole (PROTONIX) 40 MG tablet Take 40 mg by mouth  daily.    . polyethylene glycol (MIRALAX) 17 g packet Take 17 g by mouth 2 (two) times daily. 28 each 0  . QUEtiapine (SEROQUEL XR) 400 MG 24 hr tablet     . ranitidine (ZANTAC) 150 MG tablet Take 300 mg by mouth 3 (three) times daily.    . SUMAtriptan (IMITREX) 100 MG tablet Take 100 mg by mouth as needed for migraine.     . vitamin B-12 (CYANOCOBALAMIN) 1000 MCG tablet Take 1,000 mcg by mouth daily.    Marland Kitchen zolpidem (AMBIEN) 10 MG tablet Take 10 mg by mouth at bedtime as needed for sleep.     No current facility-administered medications on file prior to visit.    Allergies  Allergen Reactions  . Penicillins Hives and Other (See Comments)    Has patient had a PCN reaction causing immediate rash, facial/tongue/throat swelling, SOB or lightheadedness with hypotension: No Has patient had a PCN reaction causing severe rash involving mucus membranes or skin necrosis: No Has patient had a PCN reaction that required hospitalization No Has patient had a PCN reaction occurring within the last 10 years: No If all of the above answers are "NO", then may proceed with Cephalosporin use.    Objective: Physical Exam General: The patient is alert and oriented x3 in no acute  distress.  Dermatology: Skin is slightly cool, dry, and supple bilateral lower extremities. Nails 1-10 are normal. There is no erythema, edema, no eccymosis, no open lesions present. Integument is otherwise unremarkable.  Vascular: Dorsalis Pedis pulse and Posterior Tibial pulse are 1/4 bilateral. Capillary fill time is 5 to all digits.  Minimal purple discoloration to feet.  Neurological: Grossly intact to light touch bilateral.   Musculoskeletal: Tenderness to palpation at the medial calcaneal tubercale and through the insertion of the plantar fascia on the left and right foot that extends into the arch and to the ball the foot making the entire plantar surface is very tender to touch with pain as well at the Achilles insertion  bilateral right greater than left.  Guarded range of motion bilateral due to pain like before.    Assessment and Plan: Problem List Items Addressed This Visit    None    Visit Diagnoses    Plantar fasciitis, bilateral    -  Primary   Heel pain, bilateral       Inflammatory heel pain, unspecified laterality       Pain in both feet       PVD (peripheral vascular disease) (HCC)       Swelling       Rheumatoid arthritis of other site, unspecified whether rheumatoid factor present (HCC)       Relevant Orders   ANA   C-reactive protein   Uric acid   Rheumatoid factor   Sedimentation rate   HLA-B27 antigen   CBC with Differential/Platelet      -Complete examination performed.  -Previous Xrays reviewed -Discussed with patient in detail the condition of plantar fasciitis with likely compensation tendinitis and severe inflammatory heel pain, with no improvement after injections  -Recommended arthritic panel for history of pain and multiple joint issues with now new rashes on elbow and leg -Discussed results of venous studies, recommend follow up with vein and vascular for swelling  -Patient to return to office after testing/blood work or sooner if problems or questions arise.  Landis Martins, DPM

## 2020-06-21 ENCOUNTER — Telehealth: Payer: Self-pay | Admitting: *Deleted

## 2020-06-21 ENCOUNTER — Telehealth: Payer: Self-pay | Admitting: Sports Medicine

## 2020-06-21 LAB — URIC ACID: Uric Acid, Serum: 1.9 mg/dL — ABNORMAL LOW (ref 2.5–7.0)

## 2020-06-21 LAB — CBC WITH DIFFERENTIAL/PLATELET
Absolute Monocytes: 311 cells/uL (ref 200–950)
Basophils Absolute: 32 cells/uL (ref 0–200)
Basophils Relative: 0.7 %
Eosinophils Absolute: 122 cells/uL (ref 15–500)
Eosinophils Relative: 2.7 %
HCT: 41.4 % (ref 35.0–45.0)
Hemoglobin: 14.1 g/dL (ref 11.7–15.5)
Lymphs Abs: 2007 cells/uL (ref 850–3900)
MCH: 33.3 pg — ABNORMAL HIGH (ref 27.0–33.0)
MCHC: 34.1 g/dL (ref 32.0–36.0)
MCV: 97.9 fL (ref 80.0–100.0)
MPV: 10.2 fL (ref 7.5–12.5)
Monocytes Relative: 6.9 %
Neutro Abs: 2030 cells/uL (ref 1500–7800)
Neutrophils Relative %: 45.1 %
Platelets: 249 10*3/uL (ref 140–400)
RBC: 4.23 10*6/uL (ref 3.80–5.10)
RDW: 13 % (ref 11.0–15.0)
Total Lymphocyte: 44.6 %
WBC: 4.5 10*3/uL (ref 3.8–10.8)

## 2020-06-21 LAB — HLA-B27 ANTIGEN: HLA-B27 Antigen: NEGATIVE

## 2020-06-21 LAB — C-REACTIVE PROTEIN: CRP: 2.6 mg/L (ref <8.0)

## 2020-06-21 LAB — RHEUMATOID FACTOR: Rheumatoid fact SerPl-aCnc: <14 IU/mL (ref <14)

## 2020-06-21 LAB — SEDIMENTATION RATE: Sed Rate: 11 mm/h (ref 0–20)

## 2020-06-21 LAB — ANA: Anti Nuclear Antibody (ANA): NEGATIVE

## 2020-06-21 NOTE — Telephone Encounter (Signed)
This referral made through [facility].

## 2020-06-21 NOTE — Telephone Encounter (Signed)
Pt called and wanted to to request rheumatoligst [facility] [address]

## 2020-06-21 NOTE — Telephone Encounter (Signed)
Error

## 2020-06-21 NOTE — Telephone Encounter (Signed)
-----   Message from Asencion Islam, [place] sent at 06/21/2020  3:34 PM EDT ----- Lorain Childes sent mychart message to patient: Ellason your blood work shows that your uric acid level is low. At this time I do think it is best for [place] to send you to a rheumatologist for your joints and the pain that you are having. If you are agreeable call the office to let my nurse know and we will get the referral sent over to see the Rheumatologist.  Thanks Dr. Marylene Land

## 2020-06-22 ENCOUNTER — Telehealth: Payer: Self-pay | Admitting: *Deleted

## 2020-06-22 DIAGNOSIS — M79673 Pain in unspecified foot: Secondary | ICD-10-CM

## 2020-06-22 DIAGNOSIS — R609 Edema, unspecified: Secondary | ICD-10-CM

## 2020-06-22 DIAGNOSIS — I739 Peripheral vascular disease, unspecified: Secondary | ICD-10-CM

## 2020-06-22 DIAGNOSIS — M722 Plantar fascial fibromatosis: Secondary | ICD-10-CM

## 2020-06-22 NOTE — Telephone Encounter (Signed)
Faxed required form, clinical and demographics to [facility]. 

## 2020-06-22 NOTE — Telephone Encounter (Signed)
-----   Message from Titorya Stover, DPM sent at 06/21/2020  3:34 PM EDT ----- °FYI sent mychart message to patient: °Alison Blake your blood work shows that your uric acid level is low. At this time I do think it is best for us to send you to a rheumatologist for your joints and the pain that you are having. If you are agreeable call the office to let my nurse know and we will get the referral sent over to see the Rheumatologist.  °Thanks °Dr. Stover °

## 2020-07-31 ENCOUNTER — Other Ambulatory Visit: Payer: Self-pay

## 2020-07-31 DIAGNOSIS — M7989 Other specified soft tissue disorders: Secondary | ICD-10-CM

## 2020-08-08 ENCOUNTER — Ambulatory Visit (INDEPENDENT_AMBULATORY_CARE_PROVIDER_SITE_OTHER): Payer: Medicare Other | Admitting: Physician Assistant

## 2020-08-08 ENCOUNTER — Other Ambulatory Visit: Payer: Self-pay

## 2020-08-08 ENCOUNTER — Encounter (HOSPITAL_COMMUNITY): Payer: Medicare Other

## 2020-08-08 ENCOUNTER — Encounter: Payer: Self-pay | Admitting: Physician Assistant

## 2020-08-08 VITALS — BP 112/82 | HR 85 | Temp 98.3°F | Resp 20 | Ht 68.0 in | Wt 212.8 lb

## 2020-08-08 DIAGNOSIS — M7989 Other specified soft tissue disorders: Secondary | ICD-10-CM

## 2020-08-08 NOTE — Progress Notes (Signed)
Office Note     CC:  follow up Requesting Provider:  Daisy Floro, MD  HPI: Alison Blake is a 49 y.o. ([birth year]) female who presents due to edema of right lower extremity.  She was referred here by her podiatrist Dr. Marylene Land.  Dr. Marylene Land ordered a venous reflux study which demonstrated greater saphenous vein reflux in the mid thigh only.  Patient states she is having tremendous pain in bilateral heels however is aware we are here to discuss edema of right lower extremity.  She does not wear compression or elevate her legs during the day.  She admittedly is not walking much however denies pain in her feet at night or nonhealing wounds of bilateral lower extremities.  She denies tobacco use.  She denies history of DVT, venous ulcerations, trauma, or any prior vascular intervention.  She has a appointment with a rheumatologist due to heel pain as well as large joint pain.  Patient also mentions that she has bipolar and is seeing a psychiatrist.  She also has depression as well as anxiety for which she has medication.  She has been evaluated by cardiology due to weekly episodes of squeezing chest pain radiating to her back that happens at rest however the work-up was negative.  She denies chest pain currently.  She states she has noticed a lack of energy and is feeling very weak.  She is finding it difficult to get up in the mornings.  She is not having any thoughts of suicide.   Past Medical History:  Diagnosis Date  . Anxiety   . Bipolar 1 disorder (HCC)   . Fibromyalgia   . IBS (irritable bowel syndrome)   . Interstitial cystitis   . Multiple personality disorder (HCC)   . Panic attacks     Past Surgical History:  Procedure Laterality Date  . ABDOMINAL HYSTERECTOMY    . APPENDECTOMY    . BLADDER SURGERY     hyperextension surgey  . BREAST LUMPECTOMY Right    benign  . CHOLECYSTECTOMY    . laproscopy     x 7    Social History   Socioeconomic History  . Marital status:  Widowed    Spouse name: Not on file  . Number of children: 2  . Years of education: Not on file  . Highest education level: Not on file  Occupational History  . Not on file  Tobacco Use  . Smoking status: Current Some Day Smoker    Types: Cigarettes  . Smokeless tobacco: Never Used  Substance and Sexual Activity  . Alcohol use: Yes    Alcohol/week: 0.0 standard drinks    Comment: social  . Drug use: No  . Sexual activity: Not on file  Other Topics Concern  . Not on file  Social History Narrative  . Not on file   Social Determinants of Health   Financial Resource Strain:   . Difficulty of Paying Living Expenses: Not on file  Food Insecurity:   . Worried About Programme researcher, broadcasting/film/video in the Last Year: Not on file  . Ran Out of Food in the Last Year: Not on file  Transportation Needs:   . Lack of Transportation (Medical): Not on file  . Lack of Transportation (Non-Medical): Not on file  Physical Activity:   . Days of Exercise per Week: Not on file  . Minutes of Exercise per Session: Not on file  Stress:   . Feeling of Stress : Not on file  Social Connections:   . Frequency of Communication with Friends and Family: Not on file  . Frequency of Social Gatherings with Friends and Family: Not on file  . Attends Religious Services: Not on file  . Active Member of Clubs or Organizations: Not on file  . Attends Banker Meetings: Not on file  . Marital Status: Not on file  Intimate Partner Violence:   . Fear of Current or Ex-Partner: Not on file  . Emotionally Abused: Not on file  . Physically Abused: Not on file  . Sexually Abused: Not on file    Family History  Problem Relation Age of Onset  . Hypertension Mother   . Diabetes Mother   . Nephrolithiasis Mother   . CAD Father 21  . Hypertension Father   . Nephrolithiasis Father   . Nephrolithiasis Sister     Current Outpatient Medications  Medication Sig Dispense Refill  . alprazolam (XANAX) 2 MG tablet  Take 2 mg by mouth 3 (three) times daily as needed for anxiety.     Marland Kitchen amphetamine-dextroamphetamine (ADDERALL) 30 MG tablet Take 30 mg by mouth daily as needed (for hyperactivity).     . Biotin (PA BIOTIN) 1000 MCG tablet Take 1,000 mcg by mouth daily.    Marland Kitchen bismuth subsalicylate (PEPTO BISMOL) 262 MG/15ML suspension Take 30 mLs by mouth every 6 (six) hours as needed for indigestion.     . dicyclomine (BENTYL) 20 MG tablet Take 1 tablet (20 mg total) by mouth 2 (two) times daily. 20 tablet 0  . estradiol (ESTRACE) 2 MG tablet Take 2 mg by mouth at bedtime.     . famotidine (PEPCID) 20 MG tablet Take 20 mg by mouth at bedtime as needed.    . fluticasone (FLONASE) 50 MCG/ACT nasal spray USE 1 2 SPRAY IN EACH NOSTRIL TWICE A DAY    . ondansetron (ZOFRAN ODT) 4 MG disintegrating tablet Take 1 tablet (4 mg total) by mouth every 8 (eight) hours as needed for nausea or vomiting. 20 tablet 0  . oxyCODONE-acetaminophen (PERCOCET) 10-325 MG per tablet Take 1 tablet by mouth every 4 (four) hours as needed for pain.     . pantoprazole (PROTONIX) 40 MG tablet Take 40 mg by mouth daily.    . polyethylene glycol (MIRALAX) 17 g packet Take 17 g by mouth 2 (two) times daily. 28 each 0  . QUEtiapine (SEROQUEL XR) 400 MG 24 hr tablet     . SUMAtriptan (IMITREX) 100 MG tablet Take 100 mg by mouth as needed for migraine.     . vitamin B-12 (CYANOCOBALAMIN) 1000 MCG tablet Take 1,000 mcg by mouth daily.     No current facility-administered medications for this visit.    Allergies  Allergen Reactions  . Penicillins Hives and Other (See Comments)    Has patient had a PCN reaction causing immediate rash, facial/tongue/throat swelling, SOB or lightheadedness with hypotension: No Has patient had a PCN reaction causing severe rash involving mucus membranes or skin necrosis: No Has patient had a PCN reaction that required hospitalization No Has patient had a PCN reaction occurring within the last 10 years: No If all of  the above answers are "NO", then may proceed with Cephalosporin use.     REVIEW OF SYSTEMS:   [X]  denotes positive finding, [ ]  denotes negative finding Cardiac  Comments:  Chest pain or chest pressure:    Shortness of breath upon exertion:    Short of breath when lying flat:  Irregular heart rhythm:        Vascular    Pain in calf, thigh, or hip brought on by ambulation:    Pain in feet at night that wakes you up from your sleep:     Blood clot in your veins:    Leg swelling:         Pulmonary    Oxygen at home:    Productive cough:     Wheezing:         Neurologic    Sudden weakness in arms or legs:     Sudden numbness in arms or legs:     Sudden onset of difficulty speaking or slurred speech:    Temporary loss of vision in one eye:     Problems with dizziness:         Gastrointestinal    Blood in stool:     Vomited blood:         Genitourinary    Burning when urinating:     Blood in urine:        Psychiatric    Major depression:         Hematologic    Bleeding problems:    Problems with blood clotting too easily:        Skin    Rashes or ulcers:        Constitutional    Fever or chills:      PHYSICAL EXAMINATION:  Vitals:   08/08/20 1444  BP: 112/82  Pulse: 85  Resp: 20  Temp: 98.3 F (36.8 C)  TempSrc: Temporal  SpO2: 96%  Weight: 212 lb 12.8 oz (96.5 kg)  Height: 5\' 8"  (1.727 m)    General:  WDWN in NAD; vital signs documented above Gait: Not observed HENT: WNL, normocephalic Pulmonary: normal non-labored breathing , without Rales, rhonchi,  wheezing Cardiac: regular HR Abdomen: soft, NT, no masses Skin: without rashes Vascular Exam/Pulses:  Right Left  Radial 2+ (normal) 2+ (normal)  DP 1+ (weak) 2+ (normal)   Extremities: Right lower extremity edema slightly more than left however no varicosities or skin changes Musculoskeletal: no muscle wasting or atrophy  Neurologic: A&O X 3;  No focal weakness or paresthesias are  detected Psychiatric:  The pt has Normal affect.   Non-Invasive Vascular Imaging:   Venous reflux study from June of this year negative for DVT Negative for deep venous reflux Mid greater saphenous vein of right lower extremity with reflux however small in diameter    ASSESSMENT/PLAN:: 49 y.o. female here for evaluation of right lower extremity edema   Venous reflux from June of this year was negative for DVT or deep venous reflux; only the mid thigh segment of her great great saphenous vein of RLE demonstrates reflux however her GSV is small in diameter.  I recommended 15-42mmHg of knee high compression to be worn daily to help with edema.  She should also elevate her legs periodically during the day, especially after sitting or standing for long periods of time.  Nothing further to offer from a vascular standpoint based on duplex findings.  She also has palpable pedal pulses bilaterally which rules out arterial insufficiency.  She has a consultation with Rheumatology next Friday for further workup of joint pain.  I also referred her back to her PCP and Psychiatry due to her depression, lack of energy, and motivation.  She can follow up with Friday on an as needed basis.    [place], PA-C [facility] [phone]  Clinic MD:   Darrick Penna

## 2020-08-09 ENCOUNTER — Encounter (HOSPITAL_COMMUNITY): Payer: Medicare Other

## 2020-12-10 DIAGNOSIS — M62830 Muscle spasm of back: Secondary | ICD-10-CM | POA: Diagnosis not present

## 2020-12-10 DIAGNOSIS — R519 Headache, unspecified: Secondary | ICD-10-CM | POA: Diagnosis not present

## 2020-12-10 DIAGNOSIS — R0902 Hypoxemia: Secondary | ICD-10-CM | POA: Diagnosis not present

## 2020-12-10 DIAGNOSIS — U071 COVID-19: Secondary | ICD-10-CM | POA: Diagnosis not present

## 2021-01-03 DIAGNOSIS — G894 Chronic pain syndrome: Secondary | ICD-10-CM | POA: Diagnosis not present

## 2021-01-03 DIAGNOSIS — Z79891 Long term (current) use of opiate analgesic: Secondary | ICD-10-CM | POA: Diagnosis not present

## 2021-01-03 DIAGNOSIS — M47814 Spondylosis without myelopathy or radiculopathy, thoracic region: Secondary | ICD-10-CM | POA: Diagnosis not present

## 2021-01-03 DIAGNOSIS — M47816 Spondylosis without myelopathy or radiculopathy, lumbar region: Secondary | ICD-10-CM | POA: Diagnosis not present

## 2021-03-04 DIAGNOSIS — G894 Chronic pain syndrome: Secondary | ICD-10-CM | POA: Diagnosis not present

## 2021-03-04 DIAGNOSIS — Z79891 Long term (current) use of opiate analgesic: Secondary | ICD-10-CM | POA: Diagnosis not present

## 2021-03-04 DIAGNOSIS — M47816 Spondylosis without myelopathy or radiculopathy, lumbar region: Secondary | ICD-10-CM | POA: Diagnosis not present

## 2021-03-04 DIAGNOSIS — M47814 Spondylosis without myelopathy or radiculopathy, thoracic region: Secondary | ICD-10-CM | POA: Diagnosis not present

## 2021-03-12 DIAGNOSIS — M25562 Pain in left knee: Secondary | ICD-10-CM | POA: Diagnosis not present

## 2021-03-12 DIAGNOSIS — S8002XA Contusion of left knee, initial encounter: Secondary | ICD-10-CM | POA: Diagnosis not present

## 2021-03-18 DIAGNOSIS — M7042 Prepatellar bursitis, left knee: Secondary | ICD-10-CM | POA: Diagnosis not present

## 2021-03-18 DIAGNOSIS — M25462 Effusion, left knee: Secondary | ICD-10-CM | POA: Diagnosis not present

## 2021-03-18 DIAGNOSIS — M25562 Pain in left knee: Secondary | ICD-10-CM | POA: Diagnosis not present

## 2021-03-19 ENCOUNTER — Other Ambulatory Visit: Payer: Self-pay | Admitting: Family Medicine

## 2021-03-19 DIAGNOSIS — R5383 Other fatigue: Secondary | ICD-10-CM | POA: Diagnosis not present

## 2021-03-19 DIAGNOSIS — Z0000 Encounter for general adult medical examination without abnormal findings: Secondary | ICD-10-CM | POA: Diagnosis not present

## 2021-03-19 DIAGNOSIS — R109 Unspecified abdominal pain: Secondary | ICD-10-CM | POA: Diagnosis not present

## 2021-03-19 DIAGNOSIS — Z79899 Other long term (current) drug therapy: Secondary | ICD-10-CM | POA: Diagnosis not present

## 2021-03-19 DIAGNOSIS — G629 Polyneuropathy, unspecified: Secondary | ICD-10-CM | POA: Diagnosis not present

## 2021-03-19 DIAGNOSIS — Z1231 Encounter for screening mammogram for malignant neoplasm of breast: Secondary | ICD-10-CM

## 2021-03-19 DIAGNOSIS — E7800 Pure hypercholesterolemia, unspecified: Secondary | ICD-10-CM | POA: Diagnosis not present

## 2021-03-19 DIAGNOSIS — B379 Candidiasis, unspecified: Secondary | ICD-10-CM | POA: Diagnosis not present

## 2021-03-19 DIAGNOSIS — K219 Gastro-esophageal reflux disease without esophagitis: Secondary | ICD-10-CM | POA: Diagnosis not present

## 2021-03-19 DIAGNOSIS — G43109 Migraine with aura, not intractable, without status migrainosus: Secondary | ICD-10-CM | POA: Diagnosis not present

## 2021-03-19 DIAGNOSIS — E039 Hypothyroidism, unspecified: Secondary | ICD-10-CM | POA: Diagnosis not present

## 2021-03-22 ENCOUNTER — Ambulatory Visit: Payer: Medicare Other | Admitting: Orthopedic Surgery

## 2021-03-28 DIAGNOSIS — M25462 Effusion, left knee: Secondary | ICD-10-CM | POA: Diagnosis not present

## 2021-04-01 DIAGNOSIS — R7989 Other specified abnormal findings of blood chemistry: Secondary | ICD-10-CM | POA: Diagnosis not present

## 2021-04-01 DIAGNOSIS — G8929 Other chronic pain: Secondary | ICD-10-CM | POA: Diagnosis not present

## 2021-04-08 DIAGNOSIS — M25569 Pain in unspecified knee: Secondary | ICD-10-CM | POA: Diagnosis not present

## 2021-04-22 DIAGNOSIS — M25462 Effusion, left knee: Secondary | ICD-10-CM | POA: Diagnosis not present

## 2021-05-03 DIAGNOSIS — G894 Chronic pain syndrome: Secondary | ICD-10-CM | POA: Diagnosis not present

## 2021-05-03 DIAGNOSIS — M47814 Spondylosis without myelopathy or radiculopathy, thoracic region: Secondary | ICD-10-CM | POA: Diagnosis not present

## 2021-05-03 DIAGNOSIS — Z79891 Long term (current) use of opiate analgesic: Secondary | ICD-10-CM | POA: Diagnosis not present

## 2021-05-03 DIAGNOSIS — M47816 Spondylosis without myelopathy or radiculopathy, lumbar region: Secondary | ICD-10-CM | POA: Diagnosis not present

## 2021-05-13 DIAGNOSIS — R7989 Other specified abnormal findings of blood chemistry: Secondary | ICD-10-CM | POA: Diagnosis not present

## 2021-05-15 ENCOUNTER — Encounter: Payer: Self-pay | Admitting: Neurology

## 2021-05-15 ENCOUNTER — Ambulatory Visit: Payer: Medicare Other | Admitting: Neurology

## 2021-05-15 VITALS — BP 113/77 | HR 70 | Ht 67.5 in | Wt 208.0 lb

## 2021-05-15 DIAGNOSIS — E531 Pyridoxine deficiency: Secondary | ICD-10-CM | POA: Diagnosis not present

## 2021-05-15 DIAGNOSIS — I7300 Raynaud's syndrome without gangrene: Secondary | ICD-10-CM | POA: Insufficient documentation

## 2021-05-15 DIAGNOSIS — E539 Vitamin B deficiency, unspecified: Secondary | ICD-10-CM | POA: Diagnosis not present

## 2021-05-15 DIAGNOSIS — M79672 Pain in left foot: Secondary | ICD-10-CM | POA: Diagnosis not present

## 2021-05-15 DIAGNOSIS — M79671 Pain in right foot: Secondary | ICD-10-CM

## 2021-05-15 DIAGNOSIS — R7879 Finding of abnormal level of heavy metals in blood: Secondary | ICD-10-CM | POA: Diagnosis not present

## 2021-05-15 NOTE — Progress Notes (Signed)
[facility]    Provider:  Dr Jaynee Eagles Requesting Provider: Lawerance Cruel, MD Primary Care Provider:  Lawerance Cruel, MD  CC:  pain in bilateral lower extremities  HPI:  Alison Blake is a 50 y.o. female here as requested by Lawerance Cruel, MD for neuropathy.  She has a past medical history of migraine with aura, sleep disorder, menopause, low back pain, bipolar, plantar fasciitis, fibromyalgia, hypothyroidism, paresthesias, depressed mood, constipation, grief reaction.  She is on gabapentin and Percocet and B12, tizanidine, sumatriptan hand and recently had a prednisone taper (in addition to other medications).    Back in the 1990s her right toe stayed black digit 3 on the right foot and she hurt all over, she was diagnosed with fibromyalgia. Her hands stay cold, her feet stay cold, her toes turn bluish-purple, just her toes, better when feet are up, feet are still cold and she is under the covers. The whole foot. She is unclear if her feet feel cold to the touch. Putting socks on helps. She is normal in-between the episodes of her feet getting cold, Happens a few times a day, she has heel pain and she can hardly get up and walk on her heels. She also saw a vein doctor and the main artery in her right leg is clogged. Stable the last few years. Son is here and provides information, all the toes turn blue. Her fingers also turn white and putting them under hot water helps. Currently no symptoms. She has a history of alcohol abuse.   I reviewed Dr. Alan Ripper notes, patient states she hurts from the bottoms of her feet legs back hands tingle feel like a bee sting, has been hurting really bad for many nights and unable to sleep, she was started on gabapentin, sent for evaluation of neuropathy.  I was also able to find a note from July 2021 for which she was seen by Dr. Cannon Kettle D.P.M., for evaluation of bilateral foot pain and swelling, she was diagnosed with plantar fasciitis,  had injections which did not help, she had pain at the heels at the back to the bottom into the joints including hip, back, elbow with a rash.  Patient diagnosed with [place] fasciitis bilaterally, heel pain bilateral, inflammatory heel pain and pain in both feet with peripheral vascular disease and ANA, CRP, RF, sed rate, HLA-B27 and CBC with differential were ordered.  ANA negative, CRP normal, rheumatoid factor negative, sed rate normal, HLA-B27 negative these labs were drawn July 2021.   Reviewed notes, labs and imaging from outside physicians, which showed:  Labs include unremarkable CBC, unremarkable CMP with BUN 14 and creatinine 0.96, B12 1145, vitamin D 69, TSH normal these labs were collected in October 2021.  Review of Systems: Patient complains of symptoms per HPI as well as the following symptoms fibromyalgia. Pertinent negatives and positives per HPI. All others negative.   Social History   Socioeconomic History   Marital status: Widowed    Spouse name: Not on file   Number of children: 2   Years of education: Not on file   Highest education level: GED or equivalent  Occupational History   Not on file  Tobacco Use   Smoking status: Former    Pack years: 0.00    Types: Cigarettes    Quit date: 01/2015    Years since quitting: 6.3   Smokeless tobacco: Never  Vaping Use   Vaping Use: Never used  Substance and Sexual Activity  Alcohol use: Not Currently    Comment: social   Drug use: Never   Sexual activity: Not on file  Other Topics Concern   Not on file  Social History Narrative   Lives at home alone   Caffeine: none    Right handed   Social Determinants of Health   Financial Resource Strain: Not on file  Food Insecurity: Not on file  Transportation Needs: Not on file  Physical Activity: Not on file  Stress: Not on file  Social Connections: Not on file  Intimate Partner Violence: Not on file    Family History  Problem Relation Age of Onset    Hypertension Mother    Diabetes Mother    Nephrolithiasis Mother    Neuropathy Mother    CAD Father 37   Hypertension Father    Nephrolithiasis Father    Nephrolithiasis Sister     Past Medical History:  Diagnosis Date   Anxiety    Bipolar 1 disorder ([place])    Depression    Fibromyalgia    IBS (irritable bowel syndrome)    Interstitial cystitis    Migraine    Multiple personality disorder ([place])    Panic attacks     Patient Active Problem List   Diagnosis Date Noted   Raynaud's phenomenon without gangrene 05/15/2021   Palpitations 09/04/2017   Toenail deformity 09/04/2017    Past Surgical History:  Procedure Laterality Date   ABDOMINAL HYSTERECTOMY     APPENDECTOMY     BLADDER SURGERY     hyperextension surgey   BREAST LUMPECTOMY Right    benign   CHOLECYSTECTOMY     laproscopy     x 7    Current Outpatient Medications  Medication Sig Dispense Refill   alprazolam (XANAX) 2 MG tablet Take 2 mg by mouth 3 (three) times daily as needed for anxiety.      Cholecalciferol (D3 5000) 125 MCG (5000 UT) capsule Take 5,000 Units by mouth daily.     dicyclomine (BENTYL) 20 MG tablet Take 1 tablet (20 mg total) by mouth 2 (two) times daily. (Patient taking differently: Take 20 mg by mouth. 3-4 times daily) 20 tablet 0   estradiol (ESTRACE) 2 MG tablet Take 2 mg by mouth at bedtime.      famotidine (PEPCID) 20 MG tablet Take 20 mg by mouth at bedtime as needed. As needed twice daily.     LATUDA 20 MG TABS tablet Take 20 mg by mouth at bedtime.     meclizine (ANTIVERT) 25 MG tablet Take 25 mg by mouth 3 (three) times daily as needed for dizziness.     methocarbamol (ROBAXIN) 750 MG tablet Take 750 mg by mouth every 6 (six) hours as needed.     Nutritional Supplements (ESTROVEN PO) Take 1 each by mouth daily.     ondansetron (ZOFRAN-ODT) 8 MG disintegrating tablet Take 8 mg by mouth as needed for nausea or vomiting.     oxyCODONE-acetaminophen (PERCOCET) 10-325 MG per tablet Take  1 tablet by mouth every 4 (four) hours as needed for pain.      pantoprazole (PROTONIX) 40 MG tablet Take 40 mg by mouth at bedtime.     vitamin B-12 (CYANOCOBALAMIN) 1000 MCG tablet Take 3,000 mcg by mouth daily.     amphetamine-dextroamphetamine (ADDERALL) 30 MG tablet Take 30 mg by mouth daily as needed (for hyperactivity).      No current facility-administered medications for this visit.    Allergies as of 05/15/2021 -  Review Complete 05/15/2021  Allergen Reaction Noted   Penicillins Hives and Other (See Comments) 01/21/2013    Vitals: BP 113/77 (BP Location: Right Arm, Patient Position: Sitting)   Pulse 70   Ht 5' 7.5" (1.715 m)   Wt 208 lb (94.3 kg)   SpO2 100%   BMI 32.10 kg/m  Last Weight:  Wt Readings from Last 1 Encounters:  05/15/21 208 lb (94.3 kg)   Last Height:   Ht Readings from Last 1 Encounters:  05/15/21 5' 7.5" (1.715 m)     Physical exam: Exam: Gen: NAD, conversant, well nourised, well groomed                     CV: RRR, no MRG. No Carotid Bruits. No peripheral edema, warm, nontender Eyes: Conjunctivae clear without exudates or hemorrhage  Neuro: Detailed Neurologic Exam  Speech:    Speech is normal; fluent and spontaneous with normal comprehension.  Cognition:    The patient is oriented to person, place, and time;     recent and remote memory intact;     language fluent;     normal attention, concentration,     fund of knowledge Cranial Nerves:    The pupils are equal, round, and reactive to light. The fundi are flat.  Visual fields are full to finger confrontation. Extraocular movements are intact. Trigeminal sensation is intact and the muscles of mastication are normal. The face is symmetric. The palate elevates in the midline. Hearing intact. Voice is normal. Shoulder shrug is normal. The tongue has normal motion without fasciculations.   Coordination:    No dysmetria or ataxia  Gait:    normal.   Motor Observation:    No asymmetry,  no atrophy, and no involuntary movements noted. Tone:    Normal muscle tone.    Posture:    Posture is normal. normal erect    Strength:    Strength is V/V in the upper and lower limbs.      Sensation: intact to LT     Reflex Exam:  DTR's:    Absent AJs. Deep tendon reflexes in the upper and lower extremities are symmetrical  bilaterally.   Toes:    The toes are downgoing bilaterally.   Clonus:    Clonus is absent.    Assessment/Plan:  Patient here for evaluation of neuropathy. She reports that her fingers turn white putting them under hot water helps. Similarly the toes get cold and turn blue and putting socks on helps. Her sensory exam is normal and her neuro exam is non focal. I don't think she has a polyneuropathy causing symptoms, sounds like Raynaud's phenomenon. She has already been tested in the past (ANA, CRP, RF, sed rate, tsh, b12, HLA-B27) by podiatry and she is seeing endocrinology who is ordering testing including hgba1c per patient. Podiatry diagnosed with plantar fasciitis causing heel pain.   - I don't see any evidence of nerve damage/polyneuropathy and sounds more like Raynaud's.  - She has heel pain and it hurts to walk on her heels in the morning and she does have a diagnosis of plantar fasciitis and heel pain is unlikely a presenting symptoms for nerve damage/polyneuropathy.  - - raynaud's phenomenon. Asked her to take a picture of the toes and fingers when they get white/blue and show to Dr. Harrington Challenger - follow up with Dr. Harrington Challenger, he may want to try treatment for the raynauds. - will check a few uncommon causes of neuropathy but patient  ha already had serum testing for multiple causes as listed above - I do not think an emg/ncs will give [place] any additional info - she has a hx of alcohol abuse which can affect nerves, she no longer drinks.     Orders Placed This Encounter  Procedures   Heavy metals, blood   Vitamin B6   Multiple Myeloma Panel (SPEP&IFE w/QIG)    Vitamin B1   No orders of the defined types were placed in this encounter.   Cc: Lawerance Cruel, MD,  Lawerance Cruel, MD  Sarina Ill, MD  [facility] [address] [place], [place] [address]  Phone [phone] Fax [phone]

## 2021-05-15 NOTE — Patient Instructions (Signed)
Raynaud Phenomenon  Raynaud phenomenon is a condition that affects the blood vessels (arteries) that carry blood to your fingers and toes. The arteries that supply blood to your ears, lips, nipples, or the tip of your nose might also be affected. Raynaud phenomenon causes the arteries to become narrow temporarily (spasm). As a result, the flow of blood to the affected areas is temporarily decreased. This usually occurs in response to cold temperatures or stress. During an attack, the skin in the affected areas turns white, then blue, andfinally red. You may also feel tingling or numbness in those areas. Attacks usually last for only a brief period, and then the blood flow to the area returns to normal. In most cases, Raynaud phenomenon does not causeserious health problems. What are the causes? In many cases, the cause of this condition is not known. The condition may occur on its own (primary Raynaud phenomenon) or may be associated with other diseases or factors (secondary Raynaud phenomenon). Possible causes may include: Diseases or medical conditions that damage the arteries. Injuries and repetitive actions that hurt the hands or feet. Being exposed to certain chemicals. Taking medicines that narrow the arteries. Other medical conditions, such as lupus, scleroderma, rheumatoid arthritis, thyroid problems, blood disorders, Sjogren syndrome, or atherosclerosis. What increases the risk? The following factors may make you more likely to develop this condition: Being 20-40 years old. Being female. Having a family history of Raynaud phenomenon. Living in a cold climate. Smoking. What are the signs or symptoms? Symptoms of this condition usually occur when you are exposed to cold temperatures or when you have emotional stress. The symptoms may last for a few minutes or up to several hours. They usually affect your fingers but may also affect your toes, nipples, lips, ears, or the tip of your nose.  Symptoms may include: Changes in skin color. The skin in the affected areas will turn pale or white. The skin may then change from white to bluish to red as normal blood flow returns to the area. Numbness, tingling, or pain in the affected areas. In severe cases, symptoms may include: Skin sores. Tissues decaying and dying (gangrene). How is this diagnosed? This condition may be diagnosed based on: Your symptoms and medical history. A physical exam. During the exam, you may be asked to put your hands in cold water to check for a reaction to cold temperature. Tests, such as: Blood tests to check for other diseases or conditions. A test to check the movement of blood through your arteries and veins (vascular ultrasound). A test in which the skin at the base of your fingernail is examined under a microscope (nailfold capillaroscopy). How is this treated? Treatment for this condition often involves making lifestyle changes and taking steps to control your exposure to cold temperatures. For more severe cases, medicine (calcium channel blockers) may be used to improve blood flow. Surgery is sometimes done to block thenerves that control the affected arteries, but this is rare. Follow these instructions at home: Avoiding cold temperatures Take these steps to avoid exposure to cold: If possible, stay indoors during cold weather. When you go outside during cold weather, dress in layers and wear mittens, a hat, a scarf, and warm footwear. Wear mittens or gloves when handling ice or frozen food. Use holders for glasses or cans containing cold drinks. Let warm water run for a while before taking a shower or bath. Warm up the car before driving in cold weather. Lifestyle If possible, avoid stressful and   emotional situations. Try to find ways to manage your stress, such as: Exercise. Yoga. Meditation. Biofeedback. Do not use any products that contain nicotine or tobacco, such as cigarettes and  e-cigarettes. If you need help quitting, ask your health care provider. Avoid secondhand smoke. Limit your use of caffeine. Switch to decaffeinated coffee, tea, and soda. Avoid chocolate. Avoid vibrating tools and machinery. General instructions Protect your hands and feet from injuries, cuts, or bruises. Avoid wearing tight rings or wristbands. Wear loose fitting socks and comfortable, roomy shoes. Take over-the-counter and prescription medicines only as told by your health care provider. Contact a health care provider if: Your discomfort becomes worse despite lifestyle changes. You develop sores on your fingers or toes that do not heal. Your fingers or toes turn black. You have breaks in the skin on your fingers or toes. You have a fever. You have pain or swelling in your joints. You have a rash. Your symptoms occur on only one side of your body. Summary Raynaud phenomenon is a condition that affects the arteries that carry blood to your fingers, toes, ears, lips, nipples, or the tip of your nose. In many cases, the cause of this condition is not known. Symptoms of this condition include changes in skin color, and numbness and tingling of the affected area. Treatment for this condition includes lifestyle changes, reducing exposure to cold temperatures, and using medicines for severe cases of the condition. Contact your health care provider if your condition worsens despite treatment. This information is not intended to replace advice given to you by your health care provider. Make sure you discuss any questions you have with your healthcare provider. Document Revised: 02/28/2020 Document Reviewed: 03/29/2020 Elsevier Patient Education  2022 Elsevier Inc.  

## 2021-05-17 ENCOUNTER — Ambulatory Visit
Admission: RE | Admit: 2021-05-17 | Discharge: 2021-05-17 | Disposition: A | Discharge status: Home / Self Care | Payer: Medicare Other | Source: Ambulatory Visit | Attending: Family Medicine | Admitting: Family Medicine

## 2021-05-17 ENCOUNTER — Other Ambulatory Visit: Payer: Self-pay

## 2021-05-17 DIAGNOSIS — Z1231 Encounter for screening mammogram for malignant neoplasm of breast: Secondary | ICD-10-CM | POA: Diagnosis not present

## 2021-05-20 DIAGNOSIS — R7989 Other specified abnormal findings of blood chemistry: Secondary | ICD-10-CM | POA: Diagnosis not present

## 2021-05-20 DIAGNOSIS — G8929 Other chronic pain: Secondary | ICD-10-CM | POA: Diagnosis not present

## 2021-05-21 ENCOUNTER — Encounter: Payer: Self-pay | Admitting: Neurology

## 2021-05-21 LAB — MULTIPLE MYELOMA PANEL, SERUM
Albumin SerPl Elph-Mcnc: 4.2 g/dL (ref 2.9–4.4)
Albumin/Glob SerPl: 1.3 (ref 0.7–1.7)
Alpha 1: 0.3 g/dL (ref 0.0–0.4)
Alpha2 Glob SerPl Elph-Mcnc: 0.8 g/dL (ref 0.4–1.0)
B-Globulin SerPl Elph-Mcnc: 1.3 g/dL (ref 0.7–1.3)
Gamma Glob SerPl Elph-Mcnc: 1 g/dL (ref 0.4–1.8)
Globulin, Total: 3.4 g/dL (ref 2.2–3.9)
IgA/Immunoglobulin A, Serum: 292 mg/dL (ref 87–352)
IgG (Immunoglobin G), Serum: 1123 mg/dL (ref 586–1602)
IgM (Immunoglobulin M), Srm: 110 mg/dL (ref 26–217)
Total Protein: 7.6 g/dL (ref 6.0–8.5)

## 2021-05-21 LAB — VITAMIN B6: Vitamin B6: 81.1 ug/L — ABNORMAL HIGH (ref 3.4–65.2)

## 2021-05-21 LAB — HEAVY METALS, BLOOD
Arsenic: <1 ug/L (ref 0–9)
Lead, Blood: <1 ug/dL (ref 0–4)
Mercury: <1 ug/L (ref 0.0–14.9)

## 2021-05-21 LAB — VITAMIN B1: Thiamine: 162.1 nmol/L (ref 66.5–200.0)

## 2021-07-01 DIAGNOSIS — Z79891 Long term (current) use of opiate analgesic: Secondary | ICD-10-CM | POA: Diagnosis not present

## 2021-07-01 DIAGNOSIS — M47815 Spondylosis without myelopathy or radiculopathy, thoracolumbar region: Secondary | ICD-10-CM | POA: Diagnosis not present

## 2021-07-01 DIAGNOSIS — G894 Chronic pain syndrome: Secondary | ICD-10-CM | POA: Diagnosis not present

## 2021-09-02 DIAGNOSIS — M47816 Spondylosis without myelopathy or radiculopathy, lumbar region: Secondary | ICD-10-CM | POA: Diagnosis not present

## 2021-09-02 DIAGNOSIS — G894 Chronic pain syndrome: Secondary | ICD-10-CM | POA: Diagnosis not present

## 2021-09-02 DIAGNOSIS — M47814 Spondylosis without myelopathy or radiculopathy, thoracic region: Secondary | ICD-10-CM | POA: Diagnosis not present

## 2021-09-02 DIAGNOSIS — Z79891 Long term (current) use of opiate analgesic: Secondary | ICD-10-CM | POA: Diagnosis not present

## 2021-10-07 ENCOUNTER — Ambulatory Visit: Payer: Medicare Other | Admitting: Internal Medicine

## 2021-10-07 ENCOUNTER — Other Ambulatory Visit: Payer: Self-pay

## 2021-10-07 ENCOUNTER — Encounter: Payer: Self-pay | Admitting: Internal Medicine

## 2021-10-07 VITALS — BP 110/80 | HR 74 | Ht 67.5 in | Wt 216.2 lb

## 2021-10-07 DIAGNOSIS — Z8639 Personal history of other endocrine, nutritional and metabolic disease: Secondary | ICD-10-CM

## 2021-10-07 DIAGNOSIS — R7989 Other specified abnormal findings of blood chemistry: Secondary | ICD-10-CM

## 2021-10-07 LAB — T4, FREE: Free T4: 0.62 ng/dL (ref 0.60–1.60)

## 2021-10-07 LAB — T3, FREE: T3, Free: 2.6 pg/mL (ref 2.3–4.2)

## 2021-10-07 LAB — TSH: TSH: 0.55 u[IU]/mL (ref 0.35–5.50)

## 2021-10-07 NOTE — Progress Notes (Signed)
Name: Alison Blake  MRN/ DOB: [id number], 06/07/1971    Age/ Sex: 50 y.o., female    PCP: Daisy Floro, MD   Reason for Endocrinology Evaluation: Hypothyroidism     Date of Initial Endocrinology Evaluation: 10/07/2021     HPI: Alison Blake is a 50 y.o. female with a past medical history of Migraine headaches, Depression/Anxiety . The patient presented for initial endocrinology clinic visit on 10/07/2021 for consultative assistance with her Hypothyroidism.   Transferred care from Dr. Ronnette Hila  She is accompanied by her son   She has been diagnosed with Hyperthyroidism  earlier in  03/2021  per pt ( records not available ). No treatment has been offered in the past. She denies weight loss but has weight gain . Has occasional chest pain  ( saw cardiology ) but no palpitations . Denies loose stools or diarrhea, has chronic constipation .   She has dysphagia to pills but neck swelling per se     She also tells me she has elevated cortisol that was checked at PCP 's office for evaluation of fatigue. She had left knee inta-articular in 03/2021 No hx of oral prednisone  Has easy bruisability   She sees neurology for cold hands and feet, question raynaud's  Has not taken Adderall in 3 months No Biotin   Denies FH of thyroid disease    HISTORY:  Past Medical History:  Past Medical History:  Diagnosis Date   Anxiety    Bipolar 1 disorder (H[facility])    Depression    Fibromyalgia    IBS (irritable bowel syndrome)    Interstitial cystitis    Migraine    Multiple personality disorder (H[facility])    Panic attacks    Past Surgical History:  Past Surgical History:  Procedure Laterality Date   ABDOMINAL HYSTERECTOMY     APPENDECTOMY     BLADDER SURGERY     hyperextension surgey   BREAST LUMPECTOMY Right    benign   CHOLECYSTECTOMY     laproscopy     x 7    Social History:  reports that she quit smoking about 6 years ago. Her smoking use included cigarettes. She has  never used smokeless tobacco. She reports that she does not currently use alcohol. She reports that she does not use drugs. Family History: family history includes CAD (age of onset: 27) in her father; Diabetes in her mother; Hypertension in her father and mother; Nephrolithiasis in her father, mother, and sister; Neuropathy in her mother.   HOME MEDICATIONS: Allergies as of 10/07/2021       Reactions   Penicillins Hives, Other (See Comments)   Has patient had a PCN reaction causing immediate rash, facial/tongue/throat swelling, SOB or lightheadedness with hypotension: No Has patient had a PCN reaction causing severe rash involving mucus membranes or skin necrosis: No Has patient had a PCN reaction that required hospitalization No Has patient had a PCN reaction occurring within the last 10 years: No If all of the above answers are "NO", then may proceed with Cephalosporin use.        Medication List        Accurate as of October 07, 2021  8:36 AM. If you have any questions, ask your nurse or doctor.          STOP taking these medications    D3 5000 125 MCG (5000 UT) capsule Generic drug: Cholecalciferol Stopped by: Scarlette Shorts, MD  TAKE these medications    alprazolam 2 MG tablet Commonly known as: XANAX Take 2 mg by mouth 3 (three) times daily as needed for anxiety.   amphetamine-dextroamphetamine 30 MG tablet Commonly known as: ADDERALL Take 30 mg by mouth daily as needed (for hyperactivity).   dicyclomine 20 MG tablet Commonly known as: BENTYL Take 1 tablet (20 mg total) by mouth 2 (two) times daily. What changed:  when to take this additional instructions   estradiol 2 MG tablet Commonly known as: ESTRACE Take 2 mg by mouth at bedtime.   ESTROVEN PO Take 1 each by mouth daily.   famotidine 20 MG tablet Commonly known as: PEPCID Take 20 mg by mouth at bedtime as needed. As needed twice daily.   Latuda 20 MG Tabs tablet Generic drug:  lurasidone Take 20 mg by mouth at bedtime.   meclizine 25 MG tablet Commonly known as: ANTIVERT Take 25 mg by mouth 3 (three) times daily as needed for dizziness.   methocarbamol 750 MG tablet Commonly known as: ROBAXIN Take 750 mg by mouth every 6 (six) hours as needed.   ondansetron 8 MG disintegrating tablet Commonly known as: ZOFRAN-ODT Take 8 mg by mouth as needed for nausea or vomiting.   oxyCODONE-acetaminophen 10-325 MG tablet Commonly known as: PERCOCET Take 1 tablet by mouth every 4 (four) hours as needed for pain.   pantoprazole 40 MG tablet Commonly known as: PROTONIX Take 40 mg by mouth at bedtime.   vitamin B-12 1000 MCG tablet Commonly known as: CYANOCOBALAMIN Take 3,000 mcg by mouth daily.          REVIEW OF SYSTEMS: A comprehensive ROS was conducted with the patient and is negative except as per HPI   OBJECTIVE:  VS: BP (!) 136/94   Pulse 74   Ht 5' 7.5" (1.715 m)   Wt 216 lb 3.2 oz (98.1 kg)   SpO2 99%   BMI 33.36 kg/m    Wt Readings from Last 3 Encounters:  10/07/21 216 lb 3.2 oz (98.1 kg)  05/15/21 208 lb (94.3 kg)  08/08/20 212 lb 12.8 oz (96.5 kg)     EXAM: General: Pt appears well and is in NAD  Neck: General: Supple without adenopathy. Thyroid: Thyroid size normal.  No goiter or nodules appreciated.   Lungs: Clear with good BS bilat with no rales, rhonchi, or wheezes  Heart: Auscultation: RRR.  Abdomen: Normoactive bowel sounds, soft, nontender, without masses or organomegaly palpable  Extremities:  BL LE: No pretibial edema normal ROM and strength.  Skin: Hair: Texture and amount normal with gender appropriate distribution Skin Inspection: No rashes Skin Palpation: Skin temperature, texture, and thickness normal to palpation  Neuro:  DTRs: 2+ and symmetric in UE without delay in relaxation phase  Mental Status: Judgment, insight: Intact Orientation: Oriented to time, place, and person Mood and affect: No depression, anxiety,  or agitation     DATA REVIEWED: Results for Alison Blake, Alison Blake (MRN [id number]) as of 10/07/2021 12:50  Ref. Range 10/07/2021 09:20  TSH Latest Ref Range: 0.35 - 5.50 uIU/mL 0.55  Triiodothyronine,Free,Serum Latest Ref Range: 2.3 - 4.2 pg/mL 2.6  T4,Free(Direct) Latest Ref Range: 0.60 - 1.60 ng/dL 6.65      ASSESSMENT/PLAN/RECOMMENDATIONS:   1.Hyperthyroidism:  - Pt is clinically euthyroid  - No local neck sympotoms - we discussed D/D of graves' disease, autonomous thyroid nodule (s), subacute thyroiditis vs  essay interference  - TSH normal at 1.070 uIU/mL on 03/19/2021 -Repeat TFTs are normal  2. Elevated Serum cortisol :   - Records not available  - No clinical suspicion for   cushing syndrome  - Discussed uncontrolled depression, medication interference as cause for false elevation of serum cortisol  - Will proceed with 24-hr urine  cortisol     F/U in 4 months      Signed electronically by: Lyndle Herrlich, MD  [facility]  [facility] [address] [place] [place], [place] [address] Phone: [phone] FAX: [phone]   CC: Daisy Floro, MD [address] [place] [address] Phone: [phone] Fax: [phone]   Return to Endocrinology clinic as below: No future appointments.

## 2021-10-07 NOTE — Patient Instructions (Signed)

## 2021-10-09 ENCOUNTER — Other Ambulatory Visit: Payer: Self-pay

## 2021-10-09 ENCOUNTER — Other Ambulatory Visit: Payer: Medicare Other

## 2021-10-09 DIAGNOSIS — R7989 Other specified abnormal findings of blood chemistry: Secondary | ICD-10-CM | POA: Diagnosis not present

## 2021-10-15 LAB — CORTISOL, URINE, 24 HOUR
24 Hour urine volume (VMAHVA): 650 mL
CREATININE, URINE: 1.32 g/(24.h) (ref 0.50–2.15)
Cortisol (Ur), Free: 16.4 mcg/24 h (ref 4.0–50.0)

## 2021-10-29 DIAGNOSIS — M47814 Spondylosis without myelopathy or radiculopathy, thoracic region: Secondary | ICD-10-CM | POA: Diagnosis not present

## 2021-10-29 DIAGNOSIS — M47816 Spondylosis without myelopathy or radiculopathy, lumbar region: Secondary | ICD-10-CM | POA: Diagnosis not present

## 2021-10-29 DIAGNOSIS — G894 Chronic pain syndrome: Secondary | ICD-10-CM | POA: Diagnosis not present

## 2021-10-29 DIAGNOSIS — Z79891 Long term (current) use of opiate analgesic: Secondary | ICD-10-CM | POA: Diagnosis not present

## 2021-11-05 DIAGNOSIS — R21 Rash and other nonspecific skin eruption: Secondary | ICD-10-CM | POA: Diagnosis not present

## 2021-11-05 DIAGNOSIS — E039 Hypothyroidism, unspecified: Secondary | ICD-10-CM | POA: Diagnosis not present

## 2021-11-05 DIAGNOSIS — R42 Dizziness and giddiness: Secondary | ICD-10-CM | POA: Diagnosis not present

## 2021-11-05 DIAGNOSIS — M79606 Pain in leg, unspecified: Secondary | ICD-10-CM | POA: Diagnosis not present

## 2021-11-05 DIAGNOSIS — K5900 Constipation, unspecified: Secondary | ICD-10-CM | POA: Diagnosis not present

## 2021-11-05 DIAGNOSIS — I7300 Raynaud's syndrome without gangrene: Secondary | ICD-10-CM | POA: Diagnosis not present

## 2021-11-22 DIAGNOSIS — M25562 Pain in left knee: Secondary | ICD-10-CM | POA: Diagnosis not present

## 2021-12-30 DIAGNOSIS — M47816 Spondylosis without myelopathy or radiculopathy, lumbar region: Secondary | ICD-10-CM | POA: Diagnosis not present

## 2021-12-30 DIAGNOSIS — Z79891 Long term (current) use of opiate analgesic: Secondary | ICD-10-CM | POA: Diagnosis not present

## 2021-12-30 DIAGNOSIS — M47814 Spondylosis without myelopathy or radiculopathy, thoracic region: Secondary | ICD-10-CM | POA: Diagnosis not present

## 2021-12-30 DIAGNOSIS — G894 Chronic pain syndrome: Secondary | ICD-10-CM | POA: Diagnosis not present

## 2022-02-03 ENCOUNTER — Telehealth: Payer: Self-pay | Admitting: Orthopedic Surgery

## 2022-02-03 NOTE — Telephone Encounter (Signed)
Pt called stating she has a referral to see Dr. August Saucerean. Don't see referral in system as of yet. Sending message to Cassandra T. Phone number is [phone]. ?

## 2022-02-05 ENCOUNTER — Ambulatory Visit: Payer: Medicare Other | Admitting: Orthopedic Surgery

## 2022-02-05 ENCOUNTER — Other Ambulatory Visit: Payer: Self-pay

## 2022-02-05 VITALS — Ht 67.75 in | Wt 225.0 lb

## 2022-02-05 DIAGNOSIS — G8929 Other chronic pain: Secondary | ICD-10-CM

## 2022-02-05 DIAGNOSIS — M25562 Pain in left knee: Secondary | ICD-10-CM | POA: Diagnosis not present

## 2022-02-06 ENCOUNTER — Encounter: Payer: Self-pay | Admitting: Neurology

## 2022-02-07 ENCOUNTER — Encounter: Payer: Self-pay | Admitting: Orthopedic Surgery

## 2022-02-07 NOTE — Progress Notes (Signed)
? ?Office Visit Note ?  ?Patient: Alison Blake           ?Date of Birth: 10/25/1971           ?MRN: [id number] ?Visit Date: 02/05/2022 ?Requested by: Daisy Florooss, Charles Alan, MD ?[address] ?[place],  [place] [id number] ?PCP: Daisy Florooss, Charles Alan, MD ? ?Subjective: ?Chief Complaint  ?Patient presents with  ? Left Knee - Pain  ? ? ?HPI: Alison Blake is a 51 year old patient with left knee pain.  She had a fall 03/07/2021.  This is a direct fall on her patella.  Reported swelling and was aspirated twice.  No prior surgery on the left knee.  Reports some numbness around the patellar region.  Also reports itching and burning.  Describes swelling after increased activity.  Pain wakes her from sleep at night.  Describes tenderness to touch.  Had bruising and ecchymosis at the time of injury which is now resolved.  She has had radiographs which were negative for fracture. ?             ?ROS: All systems reviewed are negative as they relate to the chief complaint within the history of present illness.  Patient denies  fevers or chills. ? ? ?Assessment & Plan: ?Visit Diagnoses:  ?1. Chronic pain of left knee   ? ? ?Plan: Impression is 1 year out from left knee impact injury on the patella.  Bruising ecchymosis present.  I would expect bone bruising or nondisplaced fracture to have resolved in terms of symptoms at this point.  If she did bruise one of the many crossing sensory nerves around the knee that could take a while to improve.  She does have more crepitus on the left than the right.  After a year of symptoms and failure of conservative treatment recommend MRI scanning to evaluate for likely patellofemoral chondromalacia status post fall 1 year ago.  Possible but unlikely we will find any arthroscopically treatable pathology present.  Nonetheless further imaging indicated to put the intervention issue to rest. ? ?Follow-Up Instructions: Return for after MRI.  ? ?Orders:  ?Orders Placed This Encounter  ?Procedures  ? MR Knee  Left w/o contrast  ? ?No orders of the defined types were placed in this encounter. ? ? ? ? Procedures: ?No procedures performed ? ? ?Clinical Data: ?No additional findings. ? ?Objective: ?Vital Signs: Ht 5' 7.75" (1.721 m)   Wt 225 lb (102.1 kg)   BMI 34.46 kg/m?  ? ?Physical Exam:  ? ?Constitutional: Patient appears well-developed ?HEENT:  ?Head: Normocephalic ?Eyes:EOM are normal ?Neck: Normal range of motion ?Cardiovascular: Normal rate ?Pulmonary/chest: Effort normal ?Neurologic: Patient is alert ?Skin: Skin is warm ?Psychiatric: Patient has normal mood and affect ? ? ?Ortho Exam: Ortho exam demonstrates slightly antalgic gait to the left.  No effusion is present.  More patellofemoral crepitus present on the left than the right.  Patella mobility is good in the medial and lateral plane.  Extensor mechanism is intact.  Collateral and cruciate ligaments are stable.  Pedal pulses palpable.  No other masses lymphadenopathy or skin changes noted in that left knee region. ? ?Specialty Comments:  ?No specialty comments available. ? ?Imaging: ?No results found. ? ? ?PMFS History: ?Patient Active Problem List  ? Diagnosis Date Noted  ? Elevated morning serum cortisol level 10/07/2021  ? History of hyperthyroidism 10/07/2021  ? Raynaud's phenomenon without gangrene 05/15/2021  ? Palpitations 09/04/2017  ? Toenail deformity 09/04/2017  ? ?Past Medical History:  ?Diagnosis  Date  ? Anxiety   ? Bipolar 1 disorder (HCC)   ? Depression   ? Fibromyalgia   ? IBS (irritable bowel syndrome)   ? Interstitial cystitis   ? Migraine   ? Multiple personality disorder (HCC)   ? Panic attacks   ?  ?Family History  ?Problem Relation Age of Onset  ? Hypertension Mother   ? Diabetes Mother   ? Nephrolithiasis Mother   ? Neuropathy Mother   ? CAD Father 12  ? Hypertension Father   ? Nephrolithiasis Father   ? Nephrolithiasis Sister   ?  ?Past Surgical History:  ?Procedure Laterality Date  ? ABDOMINAL HYSTERECTOMY    ? APPENDECTOMY    ?  BLADDER SURGERY    ? hyperextension surgey  ? BREAST LUMPECTOMY Right   ? benign  ? CHOLECYSTECTOMY    ? laproscopy    ? x 7  ? ?Social History  ? ?Occupational History  ? Not on file  ?Tobacco Use  ? Smoking status: Former  ?  Types: Cigarettes  ?  Quit date: 01/2015  ?  Years since quitting: 7.1  ? Smokeless tobacco: Never  ?Vaping Use  ? Vaping Use: Never used  ?Substance and Sexual Activity  ? Alcohol use: Not Currently  ?  Comment: social  ? Drug use: Never  ? Sexual activity: Not on file  ? ? ? ? ? ?

## 2022-02-10 ENCOUNTER — Ambulatory Visit: Payer: Medicare Other | Admitting: Internal Medicine

## 2022-02-18 ENCOUNTER — Ambulatory Visit
Admission: RE | Admit: 2022-02-18 | Discharge: 2022-02-18 | Disposition: A | Discharge status: Home / Self Care | Payer: Medicare Other | Source: Ambulatory Visit | Attending: Orthopedic Surgery | Admitting: Orthopedic Surgery

## 2022-02-18 ENCOUNTER — Other Ambulatory Visit: Payer: Self-pay

## 2022-02-18 DIAGNOSIS — G8929 Other chronic pain: Secondary | ICD-10-CM

## 2022-02-18 DIAGNOSIS — M25562 Pain in left knee: Secondary | ICD-10-CM | POA: Diagnosis not present

## 2022-02-21 ENCOUNTER — Ambulatory Visit (INDEPENDENT_AMBULATORY_CARE_PROVIDER_SITE_OTHER): Payer: Medicare Other | Admitting: Orthopedic Surgery

## 2022-02-21 ENCOUNTER — Other Ambulatory Visit: Payer: Self-pay

## 2022-02-21 ENCOUNTER — Telehealth: Payer: Self-pay | Admitting: Orthopedic Surgery

## 2022-02-21 DIAGNOSIS — G8929 Other chronic pain: Secondary | ICD-10-CM

## 2022-02-21 DIAGNOSIS — M25562 Pain in left knee: Secondary | ICD-10-CM | POA: Diagnosis not present

## 2022-02-21 NOTE — Telephone Encounter (Signed)
Patient called advised the bursitis has moved down her knee and can not bend her leg that good.Patient said she is worried about her knee. Patient said she can not wait 2 to 4 month like this. The number to contact patient is [phone] ?

## 2022-02-22 ENCOUNTER — Encounter: Payer: Self-pay | Admitting: Orthopedic Surgery

## 2022-02-22 NOTE — Telephone Encounter (Signed)
[facility]  No operative indication in the left knee at this time.  Recommend she consider physical therapy to increase her range of motion and strengthening in that left leg.

## 2022-02-22 NOTE — Progress Notes (Signed)
? ?Office Visit Note ?  ?Patient: Alison Blake           ?Date of Birth: 1971/04/24           ?MRN: [id number] ?Visit Date: 02/21/2022 ?Requested by: Daisy Floro, MD ?[address] ?[place],  [place] [address] ?PCP: Daisy Floro, MD ? ?Subjective: ?Chief Complaint  ?Patient presents with  ? Other  ?   ?Scan review  ? ? ?HPI: Alison Blake is a 51 y.o. female who presents to the office for MRI review. Patient denies any changes in symptoms.  Continues to complain mainly of anterior left knee pain.  She has moderate to severe pain over the anterior aspect of the left knee that is worse with pressure over the knee.  She also complains that she cannot wear certain types of pan such as leggings or jeans due to the amount of knee pain that puts her in.  She has no radiation of pain up or down her leg.  Denies any radicular pain.  She does have associated paresthesias and some occasional burning sensation in the anterior aspect of the knee in the area of her pain. ? ?MRI results revealed: ?MR Knee Left w/o contrast ? ?Result Date: 02/21/2022 ?CLINICAL DATA:  Left knee pain anteriorly. EXAM: MRI OF THE LEFT KNEE WITHOUT CONTRAST TECHNIQUE: Multiplanar, multisequence MR imaging of the knee was performed. No intravenous contrast was administered. COMPARISON:  None. FINDINGS: MENISCI Medial: Intact. Lateral: Intact. LIGAMENTS Cruciates: ACL and PCL are intact. Collaterals: Medial collateral ligament is intact. Lateral collateral ligament complex is intact. CARTILAGE Patellofemoral: Mild chondromalacia of the lateral patellar facet towards the patellar apex. Medial: Mild partial-thickness cartilage loss of the medial femorotibial compartment. Lateral:  No chondral defect. JOINT: No joint effusion. Normal Hoffa's fat-pad. No plical thickening. POPLITEAL FOSSA: Popliteus tendon is intact. No Baker's cyst. EXTENSOR MECHANISM: Intact quadriceps tendon. Intact patellar tendon. Intact lateral patellar retinaculum.  Intact medial patellar retinaculum. Intact MPFL. BONES: No aggressive osseous lesion. No fracture or dislocation. Other: Small amount of fluid along the anterior aspect of the patella most consistent with prepatellar bursitis. Muscles are normal. IMPRESSION: 1. No meniscal or ligamentous injury of the left knee. 2. Mild chondromalacia of the lateral patellar facet towards the patellar apex. 3. Mild partial-thickness cartilage loss of the medial femorotibial compartment. 4. Mild prepatellar bursitis. Electronically Signed   By: Elige Ko M.D.   On: 02/21/2022 06:33   ? ?             ?ROS: All systems reviewed are negative as they relate to the chief complaint within the history of present illness.  Patient denies fevers or chills. ? ?Assessment & Plan: ?Visit Diagnoses:  ?1. Chronic pain of left knee   ? ? ?Plan: Alison Blake is a 51 y.o. female who presents to the office for review of left knee MRI scan.  Left knee MRI did demonstrate minimal chondromalacia patella as well as mild cartilage loss of the medial compartment.  There is some mild prepatellar bursitis noted as well with some fluid identified on the axial view of the MRI scan.  She may have some contribution of chronic prepatellar bursitis to her pain with the asymmetric swelling of the left knee compared with the right as well as a point tenderness over the anterior aspect of the knee.  However, with the pain out of proportion to light touch, paresthesias, burning pain that is present on exam and by her  history today, there is likely some component of focal nerve injury that is contributing to her pain as well.  Would recommend waiting for this nerve pain to improve before considering any surgical intervention for possible chronic bursitis.  She may also be struggling with some opioid-induced hyperalgesia exacerbating her symptoms with her history of chronic pain and use of oxycodone 10 mg every 4 hours.  Plan for her to follow-up with the office  in 6 months for clinical recheck.  If she has continued severe pain in the meantime, could try physical therapy or prescription of gabapentin to see if this helps with her nerve related pain.  No operative indication at this time based on MRI and physical exam findings. ? ?Follow-Up Instructions: No follow-ups on file.  ? ?Orders:  ?No orders of the defined types were placed in this encounter. ? ?No orders of the defined types were placed in this encounter. ? ? ? ? Procedures: ?No procedures performed ? ? ?Clinical Data: ?No additional findings. ? ?Objective: ?Vital Signs: There were no vitals taken for this visit. ? ?Physical Exam:  ?Constitutional: Patient appears well-developed ?HEENT:  ?Head: Normocephalic ?Eyes:EOM are normal ?Neck: Normal range of motion ?Cardiovascular: Normal rate ?Pulmonary/chest: Effort normal ?Neurologic: Patient is alert ?Skin: Skin is warm ?Psychiatric: Patient has normal mood and affect ? ?Ortho Exam: Ortho exam demonstrates some swelling and fullness noted in the anterior aspect of the knee superficial to the patella.  This is asymmetric compared to the contralateral knee.  She has point tenderness over the anterior aspect of the knee.  She also has increased pain that is out of proportion with light touch and brushing against the skin of the left knee.  No effusion noted.  Mild to moderate joint line tenderness of the medial joint line.  Very minimal joint line tenderness of the lateral joint line.  Increased pain with terminal flexion of the left knee when she flexes to about 120 degrees.  0 degrees extension.  No pain with hip range of motion.  There is no discoloration to the anterior aspect of the knee. ? ?Specialty Comments:  ?No specialty comments available. ? ?Imaging: ?No results found. ? ? ?PMFS History: ?Patient Active Problem List  ? Diagnosis Date Noted  ? Elevated morning serum cortisol level 10/07/2021  ? History of hyperthyroidism 10/07/2021  ? Raynaud's phenomenon  without gangrene 05/15/2021  ? Palpitations 09/04/2017  ? Toenail deformity 09/04/2017  ? ?Past Medical History:  ?Diagnosis Date  ? Anxiety   ? Bipolar 1 disorder (HCC)   ? Depression   ? Fibromyalgia   ? IBS (irritable bowel syndrome)   ? Interstitial cystitis   ? Migraine   ? Multiple personality disorder (HCC)   ? Panic attacks   ?  ?Family History  ?Problem Relation Age of Onset  ? Hypertension Mother   ? Diabetes Mother   ? Nephrolithiasis Mother   ? Neuropathy Mother   ? CAD Father 28  ? Hypertension Father   ? Nephrolithiasis Father   ? Nephrolithiasis Sister   ?  ?Past Surgical History:  ?Procedure Laterality Date  ? ABDOMINAL HYSTERECTOMY    ? APPENDECTOMY    ? BLADDER SURGERY    ? hyperextension surgey  ? BREAST LUMPECTOMY Right   ? benign  ? CHOLECYSTECTOMY    ? laproscopy    ? x 7  ? ?Social History  ? ?Occupational History  ? Not on file  ?Tobacco Use  ? Smoking status: Former  ?  Types: Cigarettes  ?  Quit date: 01/2015  ?  Years since quitting: 7.1  ? Smokeless tobacco: Never  ?Vaping Use  ? Vaping Use: Never used  ?Substance and Sexual Activity  ? Alcohol use: Not Currently  ?  Comment: social  ? Drug use: Never  ? Sexual activity: Not on file  ? ? ? ? ?   ?

## 2022-02-28 DIAGNOSIS — Z79891 Long term (current) use of opiate analgesic: Secondary | ICD-10-CM | POA: Diagnosis not present

## 2022-02-28 DIAGNOSIS — M47816 Spondylosis without myelopathy or radiculopathy, lumbar region: Secondary | ICD-10-CM | POA: Diagnosis not present

## 2022-02-28 DIAGNOSIS — G894 Chronic pain syndrome: Secondary | ICD-10-CM | POA: Diagnosis not present

## 2022-02-28 DIAGNOSIS — M47814 Spondylosis without myelopathy or radiculopathy, thoracic region: Secondary | ICD-10-CM | POA: Diagnosis not present

## 2022-04-29 DIAGNOSIS — Z79891 Long term (current) use of opiate analgesic: Secondary | ICD-10-CM | POA: Diagnosis not present

## 2022-04-29 DIAGNOSIS — M47814 Spondylosis without myelopathy or radiculopathy, thoracic region: Secondary | ICD-10-CM | POA: Diagnosis not present

## 2022-04-29 DIAGNOSIS — M47816 Spondylosis without myelopathy or radiculopathy, lumbar region: Secondary | ICD-10-CM | POA: Diagnosis not present

## 2022-04-29 DIAGNOSIS — G894 Chronic pain syndrome: Secondary | ICD-10-CM | POA: Diagnosis not present

## 2022-04-30 ENCOUNTER — Other Ambulatory Visit: Payer: Self-pay

## 2022-04-30 ENCOUNTER — Ambulatory Visit: Payer: Medicare Other | Admitting: Orthopedic Surgery

## 2022-04-30 ENCOUNTER — Encounter: Payer: Self-pay | Admitting: Orthopedic Surgery

## 2022-04-30 DIAGNOSIS — G8929 Other chronic pain: Secondary | ICD-10-CM

## 2022-04-30 DIAGNOSIS — M25562 Pain in left knee: Secondary | ICD-10-CM | POA: Diagnosis not present

## 2022-04-30 MED ORDER — GABAPENTIN 100 MG PO CAPS: 100.0000 mg | ORAL_CAPSULE | Freq: Every day | ORAL | 0 refills | Status: DC

## 2022-04-30 NOTE — Progress Notes (Signed)
Office Visit Note   Patient: Alison Blake           Date of Birth: 04/20/1971           MRN: [id number] Visit Date: 04/30/2022 Requested by: Daisy Florooss, Charles Alan, MD [address] [place],  [place] [id number] PCP: Daisy Florooss, Charles Alan, MD  Subjective: Chief Complaint  Patient presents with   Left Knee - Follow-up    HPI: Alison Blake is a 51 year old patient with left knee pain.  Has burning type pain across the patella.  She has had this for about a year.  Has tried topicals.  Had a recent fall where she may be impacted in the left knee.  Her meds are reviewed and she is not currently taking Neurontin.  Denies any mechanical symptoms in the knee.  Does wake her from sleep at night.              ROS: All systems reviewed are negative as they relate to the chief complaint within the history of present illness.  Patient denies  fevers or chills.   Assessment & Plan: Visit Diagnoses:  1. Chronic pain of left knee     Plan: Impression is previous nerve anterior patella region.  Does have some mild paresthesias in this region.  Does not look like it is referred pain from the back.  Given this been a year I think she has had some recurrent impact injuries to that knee which may have This flared up.  I think being off her knees is a good idea as well as trying Neurontin just to take at night 100 mg to see if that can help with some of this nerve related pain.  Continue with topicals.  Discussed potential for surgical exploration which would be more of a needle in the haystack type of intervention versus injection which again would also be little bit of a double-blind injection.  I do not think it is bothering her enough to undergo those types of interventions.  She will follow-up with us as needed  Follow-Up Instructions: Return if symptoms worsen or fail to improve.   Orders:  No orders of the defined types were placed in this encounter.  No orders of the defined types were placed in this  encounter.     Procedures: No procedures performed   Clinical Data: No additional findings.  Objective: Vital Signs: There were no vitals taken for this visit.  Physical Exam:   Constitutional: Patient appears well-developed HEENT:  Head: Normocephalic Eyes:EOM are normal Neck: Normal range of motion Cardiovascular: Normal rate Pulmonary/chest: Effort normal Neurologic: Patient is alert Skin: Skin is warm Psychiatric: Patient has normal mood and affect   Ortho Exam: Ortho exam demonstrates slight focal swelling of the soft tissue in front of the patella on the left compared to the right.  No fluid collections present.  Collateral crucial ligaments are stable in the left knee.  No intra-articular effusion.  No groin pain on the left with internal/external Tatian of the leg.  No other masses lymphadenopathy or skin changes noted in that left leg region.  Negative Tinel's in the region of the patella.  Some paresthesias on the lateral aspect of the patella on the left-hand side.  Specialty Comments:  No specialty comments available.  Imaging: No results found.   PMFS History: Patient Active Problem List   Diagnosis Date Noted   Elevated morning serum cortisol level 10/07/2021   History of hyperthyroidism 10/07/2021   Raynaud's  phenomenon without gangrene 05/15/2021   Palpitations 09/04/2017   Toenail deformity 09/04/2017   Past Medical History:  Diagnosis Date   Anxiety    Bipolar 1 disorder (HCC)    Depression    Fibromyalgia    IBS (irritable bowel syndrome)    Interstitial cystitis    Migraine    Multiple personality disorder (HCC)    Panic attacks     Family History  Problem Relation Age of Onset   Hypertension Mother    Diabetes Mother    Nephrolithiasis Mother    Neuropathy Mother    CAD Father 23   Hypertension Father    Nephrolithiasis Father    Nephrolithiasis Sister     Past Surgical History:  Procedure Laterality Date   ABDOMINAL  HYSTERECTOMY     APPENDECTOMY     BLADDER SURGERY     hyperextension surgey   BREAST LUMPECTOMY Right    benign   CHOLECYSTECTOMY     laproscopy     x 7   Social History   Occupational History   Not on file  Tobacco Use   Smoking status: Former    Types: Cigarettes    Quit date: 01/2015    Years since quitting: 7.3   Smokeless tobacco: Never  Vaping Use   Vaping Use: Never used  Substance and Sexual Activity   Alcohol use: Not Currently    Comment: social   Drug use: Never   Sexual activity: Not on file

## 2022-05-14 DIAGNOSIS — K30 Functional dyspepsia: Secondary | ICD-10-CM | POA: Diagnosis not present

## 2022-05-14 DIAGNOSIS — E039 Hypothyroidism, unspecified: Secondary | ICD-10-CM | POA: Diagnosis not present

## 2022-05-14 DIAGNOSIS — I7300 Raynaud's syndrome without gangrene: Secondary | ICD-10-CM | POA: Diagnosis not present

## 2022-05-14 DIAGNOSIS — Z0000 Encounter for general adult medical examination without abnormal findings: Secondary | ICD-10-CM | POA: Diagnosis not present

## 2022-05-14 DIAGNOSIS — R42 Dizziness and giddiness: Secondary | ICD-10-CM | POA: Diagnosis not present

## 2022-05-14 DIAGNOSIS — G43109 Migraine with aura, not intractable, without status migrainosus: Secondary | ICD-10-CM | POA: Diagnosis not present

## 2022-05-14 DIAGNOSIS — J45909 Unspecified asthma, uncomplicated: Secondary | ICD-10-CM | POA: Diagnosis not present

## 2022-05-14 DIAGNOSIS — Z79899 Other long term (current) drug therapy: Secondary | ICD-10-CM | POA: Diagnosis not present

## 2022-05-14 DIAGNOSIS — K5900 Constipation, unspecified: Secondary | ICD-10-CM | POA: Diagnosis not present

## 2022-05-14 DIAGNOSIS — E7800 Pure hypercholesterolemia, unspecified: Secondary | ICD-10-CM | POA: Diagnosis not present

## 2022-05-14 DIAGNOSIS — K219 Gastro-esophageal reflux disease without esophagitis: Secondary | ICD-10-CM | POA: Diagnosis not present

## 2022-06-16 IMAGING — MR MR KNEE*L* W/O CM
6 series · 40 of 40 positions shown · non-contrast
Comparison: None.

CLINICAL DATA: Left knee pain anteriorly.

EXAM:
MRI OF THE LEFT KNEE WITHOUT CONTRAST
TECHNIQUE: Multiplanar, multisequence MR imaging of the knee was performed. No
intravenous contrast was administered.

[Series 6: T2 fat-sat · axial · left · 4.0mm · 0.62mm/px · z∈[-42,+111]mm · 7 of 36 slices shown (1 of 3)]
[im 1/36]
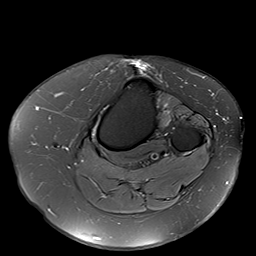
[im 6/36]
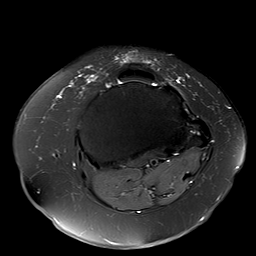
[im 12/36]
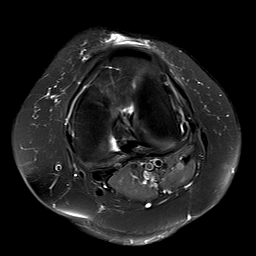
[im 18/36]
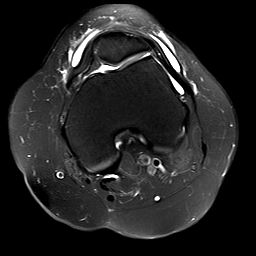
[im 24/36]
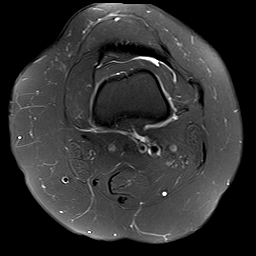
[im 30/36]
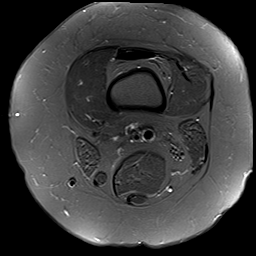
[im 36/36]
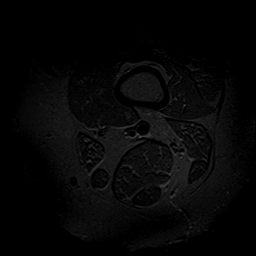

[Series 7: T2 fat-sat · coronal · left · 4.0mm · 0.62mm/px · 5 of 28 slices shown (2 of 3)]
[im 1/28]
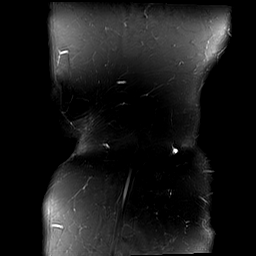
[im 7/28]
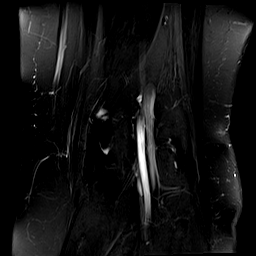
[im 14/28]
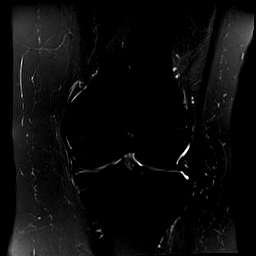
[im 21/28]
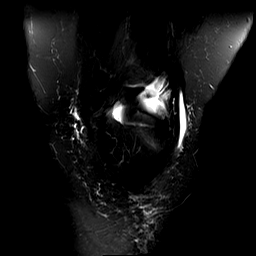
[im 28/28]
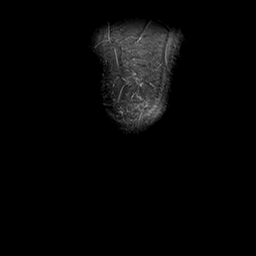

[Series 8: T1 · coronal · left · 4.0mm · 0.62mm/px · 6 of 28 slices shown]
[im 1/28]
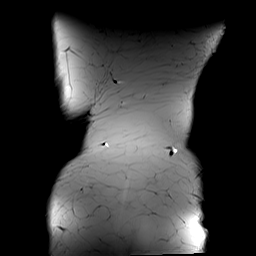
[im 6/28]
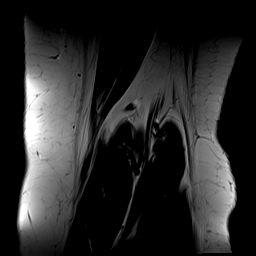
[im 11/28]
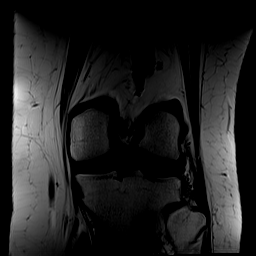
[im 17/28]
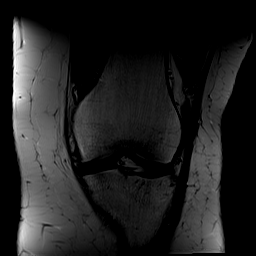
[im 22/28]
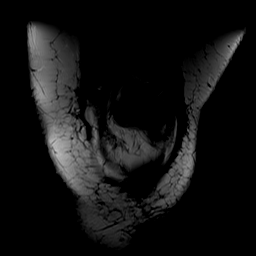
[im 28/28]
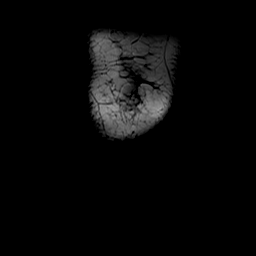

[Series 9: PD fat-sat · coronal · left · 3.0mm · 0.62mm/px · 8 of 36 slices shown (1 of 2)]
[im 1/36]
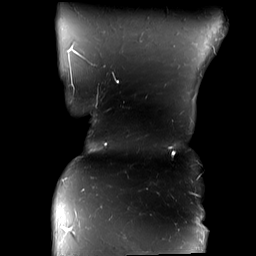
[im 6/36]
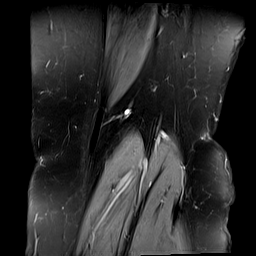
[im 11/36]
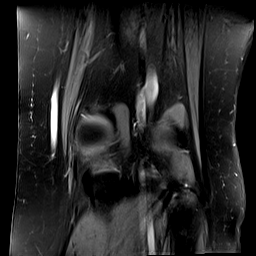
[im 16/36]
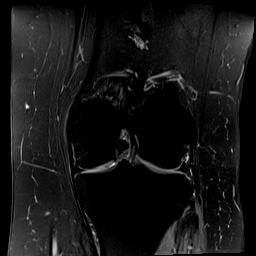
[im 21/36]
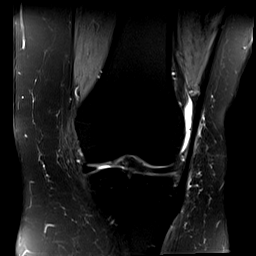
[im 26/36]
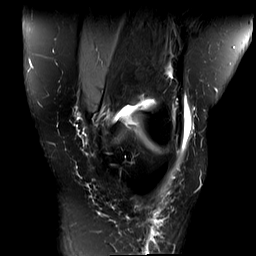
[im 31/36]
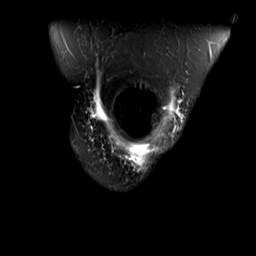
[im 36/36]
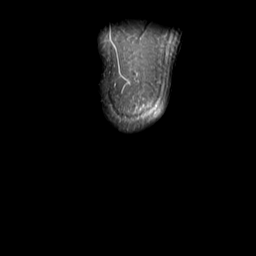

[Series 10: PD fat-sat · sagittal · left · 3.0mm · 0.62mm/px · 7 of 31 slices shown (2 of 2)]
[im 1/31]
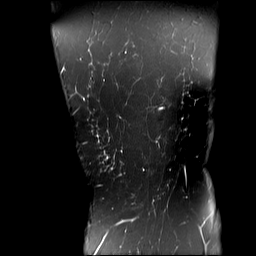
[im 6/31]
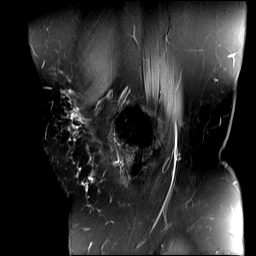
[im 11/31]
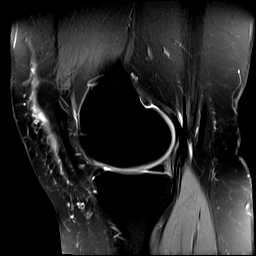
[im 16/31]
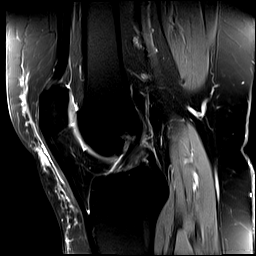
[im 21/31]
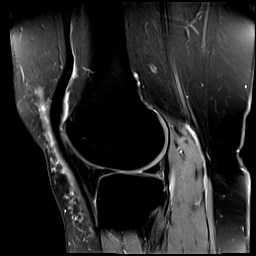
[im 26/31]
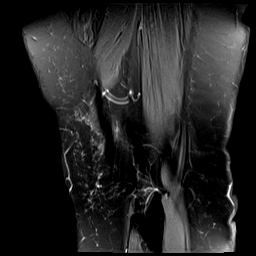
[im 31/31]
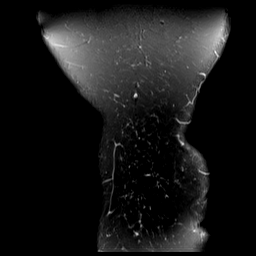

[Series 11: T2 fat-sat · sagittal · left · 3.0mm · 0.62mm/px · 7 of 31 slices shown (3 of 3)]
[im 1/31]
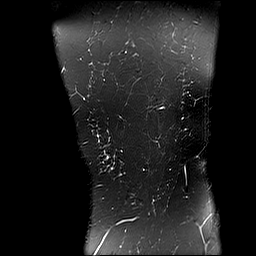
[im 6/31]
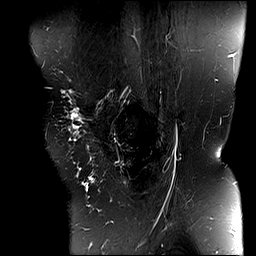
[im 11/31]
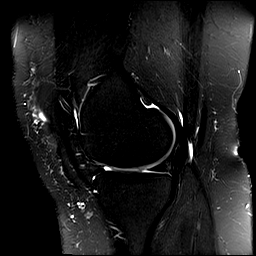
[im 16/31]
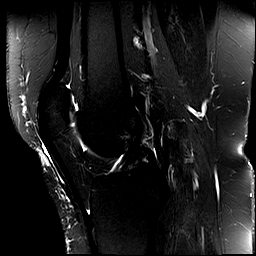
[im 21/31]
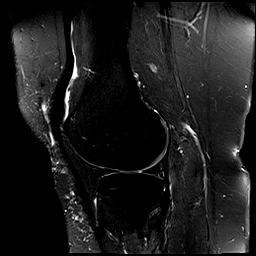
[im 26/31]
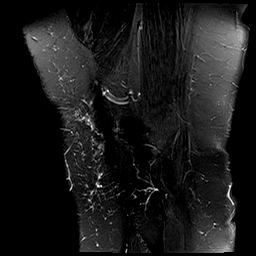
[im 31/31]
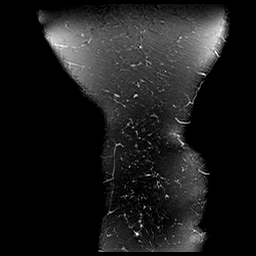

[40 of 40 positions shown; findings below may reference images not displayed]

FINDINGS: MENISCI

Medial: Intact.

Lateral: Intact.

LIGAMENTS

Cruciates: ACL and PCL are intact.

Collaterals: Medial collateral ligament is intact. Lateral
collateral ligament complex is intact.

CARTILAGE

Patellofemoral: Mild chondromalacia of the lateral patellar facet
towards the patellar apex.

Medial: Mild partial-thickness cartilage loss of the medial
femorotibial compartment.

Lateral:  No chondral defect.

JOINT: No joint effusion. Normal Petra Koshy. No plical
thickening.

POPLITEAL FOSSA: Popliteus tendon is intact. No Baker's cyst.

EXTENSOR MECHANISM: Intact quadriceps tendon. Intact patellar
tendon. Intact lateral patellar retinaculum. Intact medial patellar
retinaculum. Intact MPFL.

BONES: No aggressive osseous lesion. No fracture or dislocation.

Other: Small amount of fluid along the anterior aspect of the
patella most consistent with prepatellar bursitis. Muscles are
normal.
IMPRESSION: 1. No meniscal or ligamentous injury of the left knee.
2. Mild chondromalacia of the lateral patellar facet towards the
patellar apex.
3. Mild partial-thickness cartilage loss of the medial femorotibial
compartment.
4. Mild prepatellar bursitis.

## 2022-06-26 DIAGNOSIS — M47816 Spondylosis without myelopathy or radiculopathy, lumbar region: Secondary | ICD-10-CM | POA: Diagnosis not present

## 2022-06-26 DIAGNOSIS — M47814 Spondylosis without myelopathy or radiculopathy, thoracic region: Secondary | ICD-10-CM | POA: Diagnosis not present

## 2022-06-26 DIAGNOSIS — G894 Chronic pain syndrome: Secondary | ICD-10-CM | POA: Diagnosis not present

## 2022-06-26 DIAGNOSIS — Z79891 Long term (current) use of opiate analgesic: Secondary | ICD-10-CM | POA: Diagnosis not present

## 2022-08-25 ENCOUNTER — Other Ambulatory Visit (HOSPITAL_COMMUNITY): Payer: Self-pay

## 2022-08-25 DIAGNOSIS — Z79891 Long term (current) use of opiate analgesic: Secondary | ICD-10-CM | POA: Diagnosis not present

## 2022-08-25 DIAGNOSIS — M47816 Spondylosis without myelopathy or radiculopathy, lumbar region: Secondary | ICD-10-CM | POA: Diagnosis not present

## 2022-08-25 DIAGNOSIS — M47814 Spondylosis without myelopathy or radiculopathy, thoracic region: Secondary | ICD-10-CM | POA: Diagnosis not present

## 2022-08-25 DIAGNOSIS — G894 Chronic pain syndrome: Secondary | ICD-10-CM | POA: Diagnosis not present

## 2022-08-25 MED ORDER — OXYCODONE-ACETAMINOPHEN 10-325 MG PO TABS
1.0000 | ORAL_TABLET | ORAL | 0 refills | Status: DC
  Filled 2022-08-25: qty 180, 30d supply, fill #0

## 2022-08-25 MED ORDER — OXYCODONE-ACETAMINOPHEN 10-325 MG PO TABS
1.0000 | ORAL_TABLET | ORAL | 0 refills | Status: DC | PRN
  Filled 2022-09-24: qty 180, 30d supply, fill #0

## 2022-09-24 ENCOUNTER — Other Ambulatory Visit (HOSPITAL_COMMUNITY): Payer: Self-pay

## 2022-10-21 ENCOUNTER — Other Ambulatory Visit (HOSPITAL_COMMUNITY): Payer: Self-pay

## 2022-10-22 ENCOUNTER — Other Ambulatory Visit (HOSPITAL_COMMUNITY): Payer: Self-pay

## 2022-10-22 DIAGNOSIS — M47814 Spondylosis without myelopathy or radiculopathy, thoracic region: Secondary | ICD-10-CM | POA: Diagnosis not present

## 2022-10-22 DIAGNOSIS — M47816 Spondylosis without myelopathy or radiculopathy, lumbar region: Secondary | ICD-10-CM | POA: Diagnosis not present

## 2022-10-22 DIAGNOSIS — G894 Chronic pain syndrome: Secondary | ICD-10-CM | POA: Diagnosis not present

## 2022-10-22 DIAGNOSIS — Z79891 Long term (current) use of opiate analgesic: Secondary | ICD-10-CM | POA: Diagnosis not present

## 2022-10-22 MED ORDER — OXYCODONE-ACETAMINOPHEN 10-325 MG PO TABS
1.0000 | ORAL_TABLET | ORAL | 0 refills | Status: DC | PRN
  Filled 2022-10-22: qty 180, 30d supply, fill #0

## 2022-10-22 MED ORDER — OXYCODONE-ACETAMINOPHEN 10-325 MG PO TABS
1.0000 | ORAL_TABLET | ORAL | 0 refills | Status: DC | PRN
Start: 2022-11-19 — End: 2022-12-24
  Filled 2022-11-20: qty 180, 30d supply, fill #0

## 2022-11-14 ENCOUNTER — Other Ambulatory Visit (HOSPITAL_COMMUNITY): Payer: Self-pay

## 2022-11-14 MED ORDER — TIZANIDINE HCL 2 MG PO TABS
2.0000 mg | ORAL_TABLET | Freq: Three times a day (TID) | ORAL | 1 refills | Status: DC
  Filled 2022-11-14: qty 255, 85d supply, fill #0
  Filled 2022-11-14: qty 15, 5d supply, fill #0

## 2022-11-17 ENCOUNTER — Other Ambulatory Visit (HOSPITAL_COMMUNITY): Payer: Self-pay

## 2022-11-18 DIAGNOSIS — E7800 Pure hypercholesterolemia, unspecified: Secondary | ICD-10-CM | POA: Diagnosis not present

## 2022-11-18 DIAGNOSIS — R7303 Prediabetes: Secondary | ICD-10-CM | POA: Diagnosis not present

## 2022-11-18 DIAGNOSIS — K219 Gastro-esophageal reflux disease without esophagitis: Secondary | ICD-10-CM | POA: Diagnosis not present

## 2022-11-18 DIAGNOSIS — G629 Polyneuropathy, unspecified: Secondary | ICD-10-CM | POA: Diagnosis not present

## 2022-11-18 DIAGNOSIS — Z23 Encounter for immunization: Secondary | ICD-10-CM | POA: Diagnosis not present

## 2022-11-20 ENCOUNTER — Other Ambulatory Visit (HOSPITAL_COMMUNITY): Payer: Self-pay

## 2022-12-04 ENCOUNTER — Encounter (HOSPITAL_COMMUNITY): Payer: Self-pay

## 2022-12-04 ENCOUNTER — Ambulatory Visit (HOSPITAL_COMMUNITY)
Admission: RE | Admit: 2022-12-04 | Discharge: 2022-12-04 | Disposition: A | Discharge status: Home / Self Care | Payer: Medicare Other | Source: Ambulatory Visit | Attending: Emergency Medicine | Admitting: Emergency Medicine

## 2022-12-04 ENCOUNTER — Ambulatory Visit (INDEPENDENT_AMBULATORY_CARE_PROVIDER_SITE_OTHER): Payer: Medicare Other

## 2022-12-04 VITALS — BP 134/107 | HR 88 | Temp 97.9°F | Resp 16 | Ht 67.0 in | Wt 220.0 lb

## 2022-12-04 DIAGNOSIS — R0602 Shortness of breath: Secondary | ICD-10-CM | POA: Diagnosis not present

## 2022-12-04 DIAGNOSIS — R058 Other specified cough: Secondary | ICD-10-CM

## 2022-12-04 DIAGNOSIS — J9801 Acute bronchospasm: Secondary | ICD-10-CM | POA: Diagnosis not present

## 2022-12-04 DIAGNOSIS — R059 Cough, unspecified: Secondary | ICD-10-CM | POA: Diagnosis not present

## 2022-12-04 MED ORDER — ALBUTEROL SULFATE (2.5 MG/3ML) 0.083% IN NEBU
2.5000 mg | INHALATION_SOLUTION | Freq: Once | RESPIRATORY_TRACT | Status: AC
Start: 2022-12-04 — End: 2022-12-04
  Administered 2022-12-04: 2.5 mg via RESPIRATORY_TRACT

## 2022-12-04 MED ORDER — ACETAMINOPHEN 325 MG PO TABS
325.0000 mg | ORAL_TABLET | Freq: Once | ORAL | Status: AC
  Administered 2022-12-04: 325 mg via ORAL

## 2022-12-04 MED ORDER — PREDNISONE 20 MG PO TABS: 40.0000 mg | ORAL_TABLET | Freq: Every day | ORAL | 0 refills | Status: AC

## 2022-12-04 MED ORDER — GUAIFENESIN ER 600 MG PO TB12: 600.0000 mg | ORAL_TABLET | Freq: Two times a day (BID) | ORAL | 0 refills | Status: AC

## 2022-12-04 MED ORDER — ALBUTEROL SULFATE (2.5 MG/3ML) 0.083% IN NEBU
INHALATION_SOLUTION | RESPIRATORY_TRACT | Status: AC
  Filled 2022-12-04: qty 3

## 2022-12-04 MED ORDER — ACETAMINOPHEN 325 MG PO TABS
ORAL_TABLET | ORAL | Status: AC
  Filled 2022-12-04: qty 1

## 2022-12-04 NOTE — Discharge Instructions (Signed)
Please take the prednisone once daily for the next 5 days  Use inhaler every 6 hours for the next 3-4 days, then continue as needed.  Please take the mucinex twice daily with lots of fluids.  If symptoms do not improve, please return.

## 2022-12-04 NOTE — ED Provider Notes (Signed)
[facility]    CSN: [id number] Arrival date & time: 12/04/22  1708     History   Chief Complaint Chief Complaint  Patient presents with   Influenza    Pain in chest and coughing alot - Entered by patient    HPI Alison Blake is a 52 y.o. female.  Presents with 1 week history of cough, congestion  Reports feeling short of breath and wheezing over the last several weeks Had flu dx 12/25 Cough is now dry causing chest and back pain, headache She has tried dayquil, nyquil, alkaseltzer, and theraflu without relief. No medications today  Reports subjective fever 100.1 last night  No history of lung problems Has inhaler at home, hasn't used No known sick contacts  Past Medical History:  Diagnosis Date   Anxiety    Bipolar 1 disorder (HCC)    Depression    Fibromyalgia    IBS (irritable bowel syndrome)    Interstitial cystitis    Migraine    Multiple personality disorder (HCC)    Panic attacks     Patient Active Problem List   Diagnosis Date Noted   Elevated morning serum cortisol level 10/07/2021   History of hyperthyroidism 10/07/2021   Raynaud's phenomenon without gangrene 05/15/2021   Palpitations 09/04/2017   Toenail deformity 09/04/2017    Past Surgical History:  Procedure Laterality Date   ABDOMINAL HYSTERECTOMY     APPENDECTOMY     BLADDER SURGERY     hyperextension surgey   BREAST LUMPECTOMY Right    benign   CHOLECYSTECTOMY     laproscopy     x 7    OB History   No obstetric history on file.      Home Medications    Prior to Admission medications   Medication Sig Start Date End Date Taking? Authorizing Provider  albuterol (VENTOLIN HFA) 108 (90 Base) MCG/ACT inhaler Inhale 2 puffs into the lungs every 4 (four) hours as needed.   Yes [provider]  alprazolam (NIRAVAM) 2 MG dissolvable tablet 1 tablet   Yes [provider]  alprazolam (XANAX) 2 MG tablet Take 2 mg by mouth 3 (three) times daily as needed  for anxiety.    Yes [provider]  amLODipine (NORVASC) 2.5 MG tablet Take 2.5 mg by mouth daily. 10/29/21  Yes [provider]  amphetamine-dextroamphetamine (ADDERALL) 30 MG tablet Take 30 mg by mouth daily as needed (for hyperactivity).    Yes [provider]  cloNIDine (CATAPRES) 0.1 MG tablet Take 0.1 mg by mouth at bedtime. 12/16/21  Yes [provider]  clotrimazole-betamethasone (LOTRISONE) cream 1 application 11/05/21  Yes [provider]  dicyclomine (BENTYL) 20 MG tablet Take 1 tablet (20 mg total) by mouth 2 (two) times daily. Patient taking differently: Take 20 mg by mouth. 3-4 times daily 10/12/19  Yes Palumbo, April, MD  estradiol (ESTRACE) 2 MG tablet Take 2 mg by mouth at bedtime.    Yes [provider]  famotidine (PEPCID AC MAXIMUM STRENGTH) 20 MG tablet 1 tablet at bedtime as needed 05/19/19  Yes [provider]  famotidine (PEPCID) 20 MG tablet Take 20 mg by mouth at bedtime as needed. As needed twice daily. 02/10/20  Yes [provider]  FLUCELVAX QUADRIVALENT 0.5 ML injection  08/24/21  Yes [provider]  fluconazole (DIFLUCAN) 150 MG tablet 1 tablet 09/23/16  Yes [provider]  gabapentin (NEURONTIN) 100 MG capsule Take 1 capsule (100 mg total) by mouth  at bedtime. 04/30/22  Yes Meredith Pel, MD  guaiFENesin (MUCINEX) 600 MG 12 hr tablet Take 1 tablet (600 mg total) by mouth 2 (two) times daily for 5 days. 12/04/22 12/09/22 Yes Kyana Aicher, Wells Guiles, PA-C  hydrocortisone (ANUSOL-HC) 2.5 % rectal cream 1 application 12/04/41  Yes [provider]  LATUDA 20 MG TABS tablet Take 20 mg by mouth at bedtime. 01/03/21  Yes [provider]  levothyroxine (SYNTHROID) 50 MCG tablet Take 50 mcg by mouth every morning. 11/27/21  Yes [provider]  meclizine (ANTIVERT) 25 MG tablet Take 25 mg by mouth 3 (three) times daily as needed for dizziness.   Yes [provider]   methocarbamol (ROBAXIN) 750 MG tablet Take 750 mg by mouth every 6 (six) hours as needed. 05/07/21  Yes [provider]  naloxone (NARCAN) nasal spray 4 mg/0.1 mL SMARTSIG:1-2 Spray(s) Both Nares As Directed 10/02/21  Yes [provider]  Nutritional Supplements (ESTROVEN PO) Take 1 each by mouth daily.   Yes [provider]  ondansetron (ZOFRAN-ODT) 8 MG disintegrating tablet Take 8 mg by mouth as needed for nausea or vomiting.   Yes [provider]  oxyCODONE (OXY IR/ROXICODONE) 5 MG immediate release tablet Take 5-10 mg by mouth every 4 (four) hours as needed. 01/02/22  Yes [provider]  oxyCODONE-acetaminophen (PERCOCET) 10-325 MG per tablet Take 1 tablet by mouth every 4 (four) hours as needed for pain.    Yes [provider]  oxyCODONE-acetaminophen (PERCOCET) 10-325 MG tablet Take 1 tablet by mouth every 4 (four) hours as needed for pain. 08/25/22  Yes   oxyCODONE-acetaminophen (PERCOCET) 10-325 MG tablet Take 1 tablet by mouth every 4 (four) hours as needed for pain (fill 11/19/22). 11/19/22  Yes   oxyCODONE-acetaminophen (PERCOCET) 10-325 MG tablet Take 1 tablet by mouth every 4 (four) hours as needed for pain. 10/22/22  Yes   pantoprazole (PROTONIX) 40 MG tablet Take 40 mg by mouth at bedtime. 02/10/20  Yes [provider]  predniSONE (DELTASONE) 20 MG tablet Take 2 tablets (40 mg total) by mouth daily with breakfast for 5 days. 12/04/22 12/09/22 Yes Noriel Guthrie, Wells Guiles, PA-C  SUMAtriptan (IMITREX) 100 MG tablet TAKE 1 TABLET BY MOUTH ONCE DAILY IF NEEDED FOR MIGRAINES 30   Yes [provider]  tiZANidine (ZANAFLEX) 2 MG tablet Take 1 tablet (2 mg total) by mouth 3 (three) times daily. 11/14/22  Yes   valACYclovir (VALTREX) 1000 MG tablet 2 tablet 04/12/15  Yes [provider]  vitamin B-12 (CYANOCOBALAMIN) 1000 MCG tablet Take 3,000 mcg by mouth daily.   Yes [provider]  promethazine-dextromethorphan  (PROMETHAZINE-DM) 6.25-15 MG/5ML syrup SMARTSIG:5-10 Milliliter(s) By Mouth Every 6 Hours PRN 10/04/21   [provider]    Family History Family History  Problem Relation Age of Onset   Hypertension Mother    Diabetes Mother    Nephrolithiasis Mother    Neuropathy Mother    CAD Father 100   Hypertension Father    Nephrolithiasis Father    Nephrolithiasis Sister     Social History Social History   Tobacco Use   Smoking status: Former    Types: Cigarettes    Quit date: 01/2015    Years since quitting: 7.9   Smokeless tobacco: Never  Vaping Use   Vaping Use: Never used  Substance Use Topics   Alcohol use: Not Currently    Comment: social   Drug use: Never     Allergies   Cefdinir and  Penicillins   Review of Systems Review of Systems  As per HPI  Physical Exam Triage Vital Signs ED Triage Vitals  Enc Vitals Group     BP 12/04/22 1730 (!) 134/107     Pulse Rate 12/04/22 1730 88     Resp 12/04/22 1730 16     Temp 12/04/22 1730 97.9 F (36.6 C)     Temp Source 12/04/22 1730 Oral     SpO2 12/04/22 1730 97 %     Weight 12/04/22 1730 220 lb (99.8 kg)     Height 12/04/22 1730 5\' 7"  (1.702 m)     Head Circumference --      Peak Flow --      Pain Score 12/04/22 1729 6     Pain Loc --      Pain Edu? --      Excl. in GC? --    No data found.  Updated Vital Signs BP (!) 134/107 (BP Location: Left Arm)   Pulse 88   Temp 97.9 F (36.6 C) (Oral)   Resp 16   Ht 5\' 7"  (1.702 m)   Wt 220 lb (99.8 kg)   SpO2 97%   BMI 34.46 kg/m    Physical Exam Vitals and nursing note reviewed.  Constitutional:      General: She is not in acute distress.    Appearance: Normal appearance. She is not ill-appearing.  HENT:     Nose: No congestion or rhinorrhea.     Mouth/Throat:     Mouth: Mucous membranes are moist.     Pharynx: Oropharynx is clear. No posterior oropharyngeal erythema.  Eyes:     Conjunctiva/sclera: Conjunctivae normal.  Cardiovascular:      Rate and Rhythm: Normal rate and regular rhythm.     Pulses: Normal pulses.     Heart sounds: Normal heart sounds.  Pulmonary:     Effort: Pulmonary effort is normal.     Breath sounds: Normal breath sounds.     Comments: No acute distress, speaks in full sentences. Appears tachypneic on and off. Musculoskeletal:     Cervical back: Normal range of motion.  Skin:    General: Skin is warm and dry.  Neurological:     Mental Status: She is alert and oriented to person, place, and time.     UC Treatments / Results  Labs (all labs ordered are listed, but only abnormal results are displayed) Labs Reviewed - No data to display  EKG  Radiology DG Chest 2 View  Result Date: 12/04/2022 CLINICAL DATA:  cough x 1.5 week, shortness of breath EXAM: CHEST - 2 VIEW COMPARISON:  10/12/2019. FINDINGS: The heart size and mediastinal contours are within normal limits. Both lungs are clear. No pneumothorax or pleural effusion. There are thoracic degenerative changes. IMPRESSION: No active cardiopulmonary disease. Electronically Signed   By: 02/02/2023 M.D.   On: 12/04/2022 18:09    Procedures Procedures   Medications Ordered in UC Medications  albuterol (PROVENTIL) (2.5 MG/3ML) 0.083% nebulizer solution 2.5 mg (2.5 mg Nebulization Given 12/04/22 1810)  acetaminophen (TYLENOL) tablet 325 mg (325 mg Oral Given 12/04/22 1810)    Initial Impression / Assessment and Plan / UC Course  I have reviewed the triage vital signs and the nursing notes.  Pertinent labs & imaging results that were available during my care of the patient were reviewed by me and considered in my medical decision making (see chart for details).  Neb treatment given that brought up some  congestion Tylenol dose given Chest xray negative. Patient reports she wants to go home and lay down although still appearing short of breath. Oxygenating 97% on room air She has periods of tachypnea that resolve on their own seemingly  random.  Consider bronchospasm - she has reactive cough with deep breath. Recommend prednisone daily x 5 days in combination with inhaler every 6 hours over the next several days. Recommend mucinex BID with lots of fluids for congestion as well. Return precautions discussed. Patient agreeable to plan  Final Clinical Impressions(s) / UC Diagnoses   Final diagnoses:  Post-viral cough syndrome  Bronchospasm     Discharge Instructions      Please take the prednisone once daily for the next 5 days  Use inhaler every 6 hours for the next 3-4 days, then continue as needed.  Please take the mucinex twice daily with lots of fluids.  If symptoms do not improve, please return.     ED Prescriptions     Medication Sig Dispense Auth. Provider   predniSONE (DELTASONE) 20 MG tablet Take 2 tablets (40 mg total) by mouth daily with breakfast for 5 days. 10 tablet Jariel Drost, PA-C   guaiFENesin (MUCINEX) 600 MG 12 hr tablet Take 1 tablet (600 mg total) by mouth 2 (two) times daily for 5 days. 10 tablet Konstantin Lehnen, Wells Guiles, PA-C      PDMP not reviewed this encounter.   Kyra Leyland 12/04/22 [id number]

## 2022-12-04 NOTE — ED Triage Notes (Signed)
Chief Complaint: Patient diagnosed with the flu 11/24/22. Still having a cough with chest/back pain. Patient having head pain. Cough is dry and forceful causing the Patient to urinate on herself.   Onset: 11/24/22  Prescriptions or OTC medications tried: Yes- Dayquil, Nyquil, alka-seltzer sever cold and flu, Theraflu     with no relief  Sick exposure: Yes- flu   New foods, medications, or products: No  Recent Travel: No

## 2022-12-24 ENCOUNTER — Other Ambulatory Visit: Payer: Self-pay

## 2022-12-24 ENCOUNTER — Other Ambulatory Visit (HOSPITAL_COMMUNITY): Payer: Self-pay

## 2022-12-24 DIAGNOSIS — M7662 Achilles tendinitis, left leg: Secondary | ICD-10-CM | POA: Diagnosis not present

## 2022-12-24 DIAGNOSIS — Z79891 Long term (current) use of opiate analgesic: Secondary | ICD-10-CM | POA: Diagnosis not present

## 2022-12-24 DIAGNOSIS — M7661 Achilles tendinitis, right leg: Secondary | ICD-10-CM | POA: Diagnosis not present

## 2022-12-24 DIAGNOSIS — G894 Chronic pain syndrome: Secondary | ICD-10-CM | POA: Diagnosis not present

## 2022-12-24 MED ORDER — OXYCODONE-ACETAMINOPHEN 10-325 MG PO TABS
1.0000 | ORAL_TABLET | ORAL | 0 refills | Status: DC | PRN
  Filled 2022-12-24: qty 180, 30d supply, fill #0

## 2022-12-24 MED ORDER — TIZANIDINE HCL 2 MG PO TABS
2.0000 mg | ORAL_TABLET | Freq: Three times a day (TID) | ORAL | 1 refills | Status: DC
  Filled 2022-12-24: qty 90, 30d supply, fill #0
  Filled 2022-12-24: qty 180, 60d supply, fill #0

## 2022-12-24 MED ORDER — OXYCODONE-ACETAMINOPHEN 10-325 MG PO TABS
1.0000 | ORAL_TABLET | ORAL | 0 refills | Status: DC | PRN
  Filled 2023-01-22: qty 180, 30d supply, fill #0

## 2022-12-30 ENCOUNTER — Other Ambulatory Visit: Payer: Self-pay

## 2023-01-22 ENCOUNTER — Other Ambulatory Visit (HOSPITAL_COMMUNITY): Payer: Self-pay

## 2023-02-17 DIAGNOSIS — G629 Polyneuropathy, unspecified: Secondary | ICD-10-CM | POA: Diagnosis not present

## 2023-02-17 DIAGNOSIS — R1013 Epigastric pain: Secondary | ICD-10-CM | POA: Diagnosis not present

## 2023-02-17 DIAGNOSIS — E7800 Pure hypercholesterolemia, unspecified: Secondary | ICD-10-CM | POA: Diagnosis not present

## 2023-02-24 ENCOUNTER — Other Ambulatory Visit (HOSPITAL_COMMUNITY): Payer: Self-pay

## 2023-02-24 DIAGNOSIS — M47816 Spondylosis without myelopathy or radiculopathy, lumbar region: Secondary | ICD-10-CM | POA: Diagnosis not present

## 2023-02-24 DIAGNOSIS — G894 Chronic pain syndrome: Secondary | ICD-10-CM | POA: Diagnosis not present

## 2023-02-24 DIAGNOSIS — M47814 Spondylosis without myelopathy or radiculopathy, thoracic region: Secondary | ICD-10-CM | POA: Diagnosis not present

## 2023-02-24 DIAGNOSIS — Z79891 Long term (current) use of opiate analgesic: Secondary | ICD-10-CM | POA: Diagnosis not present

## 2023-02-24 MED ORDER — OXYCODONE-ACETAMINOPHEN 10-325 MG PO TABS
1.0000 | ORAL_TABLET | ORAL | 0 refills | Status: DC | PRN
  Filled 2023-02-24: qty 180, 30d supply, fill #0

## 2023-02-24 MED ORDER — OXYCODONE-ACETAMINOPHEN 10-325 MG PO TABS
1.0000 | ORAL_TABLET | ORAL | 0 refills | Status: DC | PRN
  Filled 2023-03-26: qty 180, 30d supply, fill #0

## 2023-02-27 ENCOUNTER — Other Ambulatory Visit (HOSPITAL_COMMUNITY): Payer: Self-pay

## 2023-03-26 ENCOUNTER — Other Ambulatory Visit (HOSPITAL_COMMUNITY): Payer: Self-pay

## 2023-04-08 ENCOUNTER — Other Ambulatory Visit: Payer: Self-pay | Admitting: Orthopedic Surgery

## 2023-04-23 ENCOUNTER — Other Ambulatory Visit (HOSPITAL_COMMUNITY): Payer: Self-pay

## 2023-04-23 DIAGNOSIS — M47814 Spondylosis without myelopathy or radiculopathy, thoracic region: Secondary | ICD-10-CM | POA: Diagnosis not present

## 2023-04-23 DIAGNOSIS — Z79891 Long term (current) use of opiate analgesic: Secondary | ICD-10-CM | POA: Diagnosis not present

## 2023-04-23 DIAGNOSIS — G894 Chronic pain syndrome: Secondary | ICD-10-CM | POA: Diagnosis not present

## 2023-04-23 DIAGNOSIS — M47816 Spondylosis without myelopathy or radiculopathy, lumbar region: Secondary | ICD-10-CM | POA: Diagnosis not present

## 2023-04-23 MED ORDER — OXYCODONE-ACETAMINOPHEN 10-325 MG PO TABS
1.0000 | ORAL_TABLET | ORAL | 0 refills | Status: DC | PRN
  Filled 2023-04-23: qty 180, 30d supply, fill #0

## 2023-04-23 MED ORDER — OXYCODONE-ACETAMINOPHEN 10-325 MG PO TABS
1.0000 | ORAL_TABLET | ORAL | 0 refills | Status: DC | PRN
  Filled 2023-05-25: qty 180, 30d supply, fill #0

## 2023-05-18 DIAGNOSIS — R7303 Prediabetes: Secondary | ICD-10-CM | POA: Diagnosis not present

## 2023-05-18 DIAGNOSIS — E7800 Pure hypercholesterolemia, unspecified: Secondary | ICD-10-CM | POA: Diagnosis not present

## 2023-05-18 DIAGNOSIS — E039 Hypothyroidism, unspecified: Secondary | ICD-10-CM | POA: Diagnosis not present

## 2023-05-18 DIAGNOSIS — Z79899 Other long term (current) drug therapy: Secondary | ICD-10-CM | POA: Diagnosis not present

## 2023-05-21 DIAGNOSIS — Z0000 Encounter for general adult medical examination without abnormal findings: Secondary | ICD-10-CM | POA: Diagnosis not present

## 2023-05-21 DIAGNOSIS — R110 Nausea: Secondary | ICD-10-CM | POA: Diagnosis not present

## 2023-05-21 DIAGNOSIS — G43109 Migraine with aura, not intractable, without status migrainosus: Secondary | ICD-10-CM | POA: Diagnosis not present

## 2023-05-21 DIAGNOSIS — R7303 Prediabetes: Secondary | ICD-10-CM | POA: Diagnosis not present

## 2023-05-21 DIAGNOSIS — Z9181 History of falling: Secondary | ICD-10-CM | POA: Diagnosis not present

## 2023-05-21 DIAGNOSIS — E039 Hypothyroidism, unspecified: Secondary | ICD-10-CM | POA: Diagnosis not present

## 2023-05-21 DIAGNOSIS — K219 Gastro-esophageal reflux disease without esophagitis: Secondary | ICD-10-CM | POA: Diagnosis not present

## 2023-05-21 DIAGNOSIS — M549 Dorsalgia, unspecified: Secondary | ICD-10-CM | POA: Diagnosis not present

## 2023-05-25 ENCOUNTER — Other Ambulatory Visit (HOSPITAL_COMMUNITY): Payer: Self-pay

## 2023-06-22 ENCOUNTER — Other Ambulatory Visit (HOSPITAL_COMMUNITY): Payer: Self-pay

## 2023-06-22 DIAGNOSIS — M47816 Spondylosis without myelopathy or radiculopathy, lumbar region: Secondary | ICD-10-CM | POA: Diagnosis not present

## 2023-06-22 DIAGNOSIS — G894 Chronic pain syndrome: Secondary | ICD-10-CM | POA: Diagnosis not present

## 2023-06-22 DIAGNOSIS — M47814 Spondylosis without myelopathy or radiculopathy, thoracic region: Secondary | ICD-10-CM | POA: Diagnosis not present

## 2023-06-22 DIAGNOSIS — Z79891 Long term (current) use of opiate analgesic: Secondary | ICD-10-CM | POA: Diagnosis not present

## 2023-06-22 MED ORDER — OXYCODONE-ACETAMINOPHEN 10-325 MG PO TABS
1.0000 | ORAL_TABLET | ORAL | 0 refills | Status: DC | PRN
  Filled 2023-06-22: qty 180, 30d supply, fill #0

## 2023-06-22 MED ORDER — OXYCODONE-ACETAMINOPHEN 10-325 MG PO TABS
1.0000 | ORAL_TABLET | ORAL | 0 refills | Status: DC | PRN
  Filled 2023-07-22: qty 180, 30d supply, fill #0

## 2023-07-06 ENCOUNTER — Other Ambulatory Visit: Payer: Self-pay | Admitting: Surgical

## 2023-07-17 DIAGNOSIS — M25571 Pain in right ankle and joints of right foot: Secondary | ICD-10-CM | POA: Diagnosis not present

## 2023-07-22 ENCOUNTER — Other Ambulatory Visit (HOSPITAL_COMMUNITY): Payer: Self-pay

## 2023-08-04 DIAGNOSIS — M25571 Pain in right ankle and joints of right foot: Secondary | ICD-10-CM | POA: Diagnosis not present

## 2023-08-17 DIAGNOSIS — Z23 Encounter for immunization: Secondary | ICD-10-CM | POA: Diagnosis not present

## 2023-08-18 ENCOUNTER — Other Ambulatory Visit: Payer: Self-pay | Admitting: Family Medicine

## 2023-08-18 DIAGNOSIS — Z1231 Encounter for screening mammogram for malignant neoplasm of breast: Secondary | ICD-10-CM

## 2023-08-18 DIAGNOSIS — E2839 Other primary ovarian failure: Secondary | ICD-10-CM

## 2023-08-21 ENCOUNTER — Other Ambulatory Visit (HOSPITAL_COMMUNITY): Payer: Self-pay

## 2023-08-21 DIAGNOSIS — G894 Chronic pain syndrome: Secondary | ICD-10-CM | POA: Diagnosis not present

## 2023-08-21 DIAGNOSIS — M47814 Spondylosis without myelopathy or radiculopathy, thoracic region: Secondary | ICD-10-CM | POA: Diagnosis not present

## 2023-08-21 DIAGNOSIS — M47816 Spondylosis without myelopathy or radiculopathy, lumbar region: Secondary | ICD-10-CM | POA: Diagnosis not present

## 2023-08-21 DIAGNOSIS — Z79891 Long term (current) use of opiate analgesic: Secondary | ICD-10-CM | POA: Diagnosis not present

## 2023-08-21 MED ORDER — OXYCODONE-ACETAMINOPHEN 10-325 MG PO TABS
ORAL_TABLET | ORAL | 0 refills | Status: DC
  Filled 2023-08-21: qty 180, 30d supply, fill #0

## 2023-08-21 MED ORDER — OXYCODONE-ACETAMINOPHEN 10-325 MG PO TABS
1.0000 | ORAL_TABLET | ORAL | 0 refills | Status: DC
  Filled 2023-09-19: qty 180, 30d supply, fill #0

## 2023-09-01 DIAGNOSIS — M25571 Pain in right ankle and joints of right foot: Secondary | ICD-10-CM | POA: Diagnosis not present

## 2023-09-19 ENCOUNTER — Other Ambulatory Visit (HOSPITAL_COMMUNITY): Payer: Self-pay

## 2023-10-22 ENCOUNTER — Other Ambulatory Visit (HOSPITAL_COMMUNITY): Payer: Self-pay

## 2023-10-22 DIAGNOSIS — G894 Chronic pain syndrome: Secondary | ICD-10-CM | POA: Diagnosis not present

## 2023-10-22 DIAGNOSIS — M47814 Spondylosis without myelopathy or radiculopathy, thoracic region: Secondary | ICD-10-CM | POA: Diagnosis not present

## 2023-10-22 DIAGNOSIS — M47816 Spondylosis without myelopathy or radiculopathy, lumbar region: Secondary | ICD-10-CM | POA: Diagnosis not present

## 2023-10-22 DIAGNOSIS — Z79891 Long term (current) use of opiate analgesic: Secondary | ICD-10-CM | POA: Diagnosis not present

## 2023-10-22 MED ORDER — OXYCODONE-ACETAMINOPHEN 10-325 MG PO TABS
1.0000 | ORAL_TABLET | ORAL | 0 refills | Status: DC
  Filled 2023-10-22: qty 180, 30d supply, fill #0

## 2023-10-22 MED ORDER — OXYCODONE-ACETAMINOPHEN 10-325 MG PO TABS
1.0000 | ORAL_TABLET | ORAL | 0 refills | Status: DC
  Filled 2023-11-20: qty 180, 30d supply, fill #0

## 2023-11-20 ENCOUNTER — Other Ambulatory Visit (HOSPITAL_COMMUNITY): Payer: Self-pay

## 2023-11-20 ENCOUNTER — Other Ambulatory Visit: Payer: Self-pay

## 2023-12-17 ENCOUNTER — Other Ambulatory Visit (HOSPITAL_COMMUNITY): Payer: Self-pay

## 2023-12-17 ENCOUNTER — Ambulatory Visit
Admission: RE | Admit: 2023-12-17 | Discharge: 2023-12-17 | Disposition: A | Discharge status: Home / Self Care | Payer: Medicare Other | Source: Ambulatory Visit | Attending: Family Medicine | Admitting: Family Medicine

## 2023-12-17 DIAGNOSIS — Z1231 Encounter for screening mammogram for malignant neoplasm of breast: Secondary | ICD-10-CM | POA: Diagnosis not present

## 2023-12-17 DIAGNOSIS — M47814 Spondylosis without myelopathy or radiculopathy, thoracic region: Secondary | ICD-10-CM | POA: Diagnosis not present

## 2023-12-17 DIAGNOSIS — M47816 Spondylosis without myelopathy or radiculopathy, lumbar region: Secondary | ICD-10-CM | POA: Diagnosis not present

## 2023-12-17 DIAGNOSIS — Z79891 Long term (current) use of opiate analgesic: Secondary | ICD-10-CM | POA: Diagnosis not present

## 2023-12-17 DIAGNOSIS — G894 Chronic pain syndrome: Secondary | ICD-10-CM | POA: Diagnosis not present

## 2023-12-17 MED ORDER — OXYCODONE-ACETAMINOPHEN 10-325 MG PO TABS
1.0000 | ORAL_TABLET | ORAL | 0 refills | Status: DC | PRN
  Filled 2023-12-19: qty 180, 30d supply, fill #0

## 2023-12-17 MED ORDER — OXYCODONE-ACETAMINOPHEN 10-325 MG PO TABS
1.0000 | ORAL_TABLET | ORAL | 0 refills | Status: DC | PRN
  Filled 2024-01-19: qty 180, 30d supply, fill #0

## 2023-12-19 ENCOUNTER — Other Ambulatory Visit (HOSPITAL_COMMUNITY): Payer: Self-pay

## 2023-12-26 ENCOUNTER — Ambulatory Visit
Admission: RE | Admit: 2023-12-26 | Discharge: 2023-12-26 | Disposition: A | Discharge status: Home / Self Care | Payer: Medicare Other | Source: Ambulatory Visit | Attending: Family Medicine | Admitting: Family Medicine

## 2023-12-26 DIAGNOSIS — E2839 Other primary ovarian failure: Secondary | ICD-10-CM

## 2023-12-26 DIAGNOSIS — M8588 Other specified disorders of bone density and structure, other site: Secondary | ICD-10-CM | POA: Diagnosis not present

## 2024-01-09 ENCOUNTER — Encounter (HOSPITAL_COMMUNITY): Payer: Self-pay | Admitting: Emergency Medicine

## 2024-01-09 ENCOUNTER — Other Ambulatory Visit: Payer: Self-pay

## 2024-01-09 ENCOUNTER — Inpatient Hospital Stay (HOSPITAL_COMMUNITY)
Admission: EM | Admit: 2024-01-09 | Discharge: 2024-01-14 | DRG: 871 | Disposition: A | Discharge status: Home / Self Care | Payer: Medicare Other | Attending: Internal Medicine | Admitting: Internal Medicine

## 2024-01-09 DIAGNOSIS — Z87891 Personal history of nicotine dependence: Secondary | ICD-10-CM

## 2024-01-09 DIAGNOSIS — J020 Streptococcal pharyngitis: Secondary | ICD-10-CM | POA: Diagnosis not present

## 2024-01-09 DIAGNOSIS — R0602 Shortness of breath: Secondary | ICD-10-CM | POA: Diagnosis not present

## 2024-01-09 DIAGNOSIS — Z8249 Family history of ischemic heart disease and other diseases of the circulatory system: Secondary | ICD-10-CM | POA: Diagnosis not present

## 2024-01-09 DIAGNOSIS — Z79890 Hormone replacement therapy (postmenopausal): Secondary | ICD-10-CM

## 2024-01-09 DIAGNOSIS — Z1152 Encounter for screening for COVID-19: Secondary | ICD-10-CM

## 2024-01-09 DIAGNOSIS — M797 Fibromyalgia: Secondary | ICD-10-CM | POA: Diagnosis not present

## 2024-01-09 DIAGNOSIS — J9601 Acute respiratory failure with hypoxia: Secondary | ICD-10-CM | POA: Diagnosis not present

## 2024-01-09 DIAGNOSIS — I10 Essential (primary) hypertension: Secondary | ICD-10-CM | POA: Diagnosis present

## 2024-01-09 DIAGNOSIS — E039 Hypothyroidism, unspecified: Secondary | ICD-10-CM | POA: Diagnosis not present

## 2024-01-09 DIAGNOSIS — Z6837 Body mass index (BMI) 37.0-37.9, adult: Secondary | ICD-10-CM

## 2024-01-09 DIAGNOSIS — J189 Pneumonia, unspecified organism: Secondary | ICD-10-CM | POA: Diagnosis not present

## 2024-01-09 DIAGNOSIS — N3010 Interstitial cystitis (chronic) without hematuria: Secondary | ICD-10-CM | POA: Diagnosis present

## 2024-01-09 DIAGNOSIS — R0902 Hypoxemia: Secondary | ICD-10-CM | POA: Diagnosis not present

## 2024-01-09 DIAGNOSIS — Z9049 Acquired absence of other specified parts of digestive tract: Secondary | ICD-10-CM

## 2024-01-09 DIAGNOSIS — K589 Irritable bowel syndrome without diarrhea: Secondary | ICD-10-CM | POA: Diagnosis present

## 2024-01-09 DIAGNOSIS — F319 Bipolar disorder, unspecified: Secondary | ICD-10-CM | POA: Diagnosis not present

## 2024-01-09 DIAGNOSIS — A419 Sepsis, unspecified organism: Principal | ICD-10-CM | POA: Diagnosis present

## 2024-01-09 DIAGNOSIS — R0989 Other specified symptoms and signs involving the circulatory and respiratory systems: Secondary | ICD-10-CM | POA: Diagnosis not present

## 2024-01-09 DIAGNOSIS — Z79899 Other long term (current) drug therapy: Secondary | ICD-10-CM

## 2024-01-09 DIAGNOSIS — G8929 Other chronic pain: Secondary | ICD-10-CM | POA: Diagnosis not present

## 2024-01-09 DIAGNOSIS — R059 Cough, unspecified: Secondary | ICD-10-CM | POA: Diagnosis not present

## 2024-01-09 DIAGNOSIS — K219 Gastro-esophageal reflux disease without esophagitis: Secondary | ICD-10-CM | POA: Diagnosis not present

## 2024-01-09 DIAGNOSIS — Z79891 Long term (current) use of opiate analgesic: Secondary | ICD-10-CM | POA: Diagnosis not present

## 2024-01-09 DIAGNOSIS — E876 Hypokalemia: Secondary | ICD-10-CM | POA: Diagnosis not present

## 2024-01-09 DIAGNOSIS — G43909 Migraine, unspecified, not intractable, without status migrainosus: Secondary | ICD-10-CM | POA: Diagnosis present

## 2024-01-09 DIAGNOSIS — E66812 Obesity, class 2: Secondary | ICD-10-CM | POA: Diagnosis present

## 2024-01-09 DIAGNOSIS — Z90710 Acquired absence of both cervix and uterus: Secondary | ICD-10-CM

## 2024-01-09 DIAGNOSIS — F909 Attention-deficit hyperactivity disorder, unspecified type: Secondary | ICD-10-CM | POA: Diagnosis present

## 2024-01-09 DIAGNOSIS — F4481 Dissociative identity disorder: Secondary | ICD-10-CM | POA: Diagnosis present

## 2024-01-09 DIAGNOSIS — Z833 Family history of diabetes mellitus: Secondary | ICD-10-CM

## 2024-01-09 DIAGNOSIS — R918 Other nonspecific abnormal finding of lung field: Secondary | ICD-10-CM | POA: Diagnosis not present

## 2024-01-09 DIAGNOSIS — Z888 Allergy status to other drugs, medicaments and biological substances status: Secondary | ICD-10-CM

## 2024-01-09 DIAGNOSIS — Z880 Allergy status to penicillin: Secondary | ICD-10-CM

## 2024-01-09 NOTE — ED Triage Notes (Signed)
 Pt in with fever/chills x 2 days, reports generalized weakness, cp, sore throat and sob as well.

## 2024-01-10 ENCOUNTER — Emergency Department (HOSPITAL_COMMUNITY): Payer: Medicare Other

## 2024-01-10 DIAGNOSIS — K219 Gastro-esophageal reflux disease without esophagitis: Secondary | ICD-10-CM

## 2024-01-10 DIAGNOSIS — R0989 Other specified symptoms and signs involving the circulatory and respiratory systems: Secondary | ICD-10-CM | POA: Diagnosis not present

## 2024-01-10 DIAGNOSIS — G8929 Other chronic pain: Secondary | ICD-10-CM | POA: Diagnosis present

## 2024-01-10 DIAGNOSIS — Z833 Family history of diabetes mellitus: Secondary | ICD-10-CM | POA: Diagnosis not present

## 2024-01-10 DIAGNOSIS — F319 Bipolar disorder, unspecified: Secondary | ICD-10-CM

## 2024-01-10 DIAGNOSIS — J189 Pneumonia, unspecified organism: Secondary | ICD-10-CM | POA: Diagnosis present

## 2024-01-10 DIAGNOSIS — A419 Sepsis, unspecified organism: Secondary | ICD-10-CM | POA: Diagnosis present

## 2024-01-10 DIAGNOSIS — I10 Essential (primary) hypertension: Secondary | ICD-10-CM | POA: Diagnosis present

## 2024-01-10 DIAGNOSIS — M797 Fibromyalgia: Secondary | ICD-10-CM | POA: Diagnosis present

## 2024-01-10 DIAGNOSIS — Z87891 Personal history of nicotine dependence: Secondary | ICD-10-CM | POA: Diagnosis not present

## 2024-01-10 DIAGNOSIS — Z90710 Acquired absence of both cervix and uterus: Secondary | ICD-10-CM | POA: Diagnosis not present

## 2024-01-10 DIAGNOSIS — Z1152 Encounter for screening for COVID-19: Secondary | ICD-10-CM | POA: Diagnosis not present

## 2024-01-10 DIAGNOSIS — Z8249 Family history of ischemic heart disease and other diseases of the circulatory system: Secondary | ICD-10-CM | POA: Diagnosis not present

## 2024-01-10 DIAGNOSIS — Z6837 Body mass index (BMI) 37.0-37.9, adult: Secondary | ICD-10-CM | POA: Diagnosis not present

## 2024-01-10 DIAGNOSIS — F909 Attention-deficit hyperactivity disorder, unspecified type: Secondary | ICD-10-CM

## 2024-01-10 DIAGNOSIS — E66812 Obesity, class 2: Secondary | ICD-10-CM | POA: Diagnosis present

## 2024-01-10 DIAGNOSIS — R0602 Shortness of breath: Secondary | ICD-10-CM | POA: Diagnosis present

## 2024-01-10 DIAGNOSIS — R0902 Hypoxemia: Secondary | ICD-10-CM | POA: Diagnosis not present

## 2024-01-10 DIAGNOSIS — J9601 Acute respiratory failure with hypoxia: Secondary | ICD-10-CM | POA: Diagnosis present

## 2024-01-10 DIAGNOSIS — J020 Streptococcal pharyngitis: Secondary | ICD-10-CM | POA: Diagnosis present

## 2024-01-10 DIAGNOSIS — N3010 Interstitial cystitis (chronic) without hematuria: Secondary | ICD-10-CM | POA: Diagnosis present

## 2024-01-10 DIAGNOSIS — F4481 Dissociative identity disorder: Secondary | ICD-10-CM | POA: Diagnosis present

## 2024-01-10 DIAGNOSIS — E039 Hypothyroidism, unspecified: Secondary | ICD-10-CM

## 2024-01-10 DIAGNOSIS — G43909 Migraine, unspecified, not intractable, without status migrainosus: Secondary | ICD-10-CM | POA: Diagnosis present

## 2024-01-10 DIAGNOSIS — E876 Hypokalemia: Secondary | ICD-10-CM | POA: Diagnosis not present

## 2024-01-10 DIAGNOSIS — R059 Cough, unspecified: Secondary | ICD-10-CM | POA: Diagnosis not present

## 2024-01-10 DIAGNOSIS — Z79890 Hormone replacement therapy (postmenopausal): Secondary | ICD-10-CM | POA: Diagnosis not present

## 2024-01-10 DIAGNOSIS — Z79891 Long term (current) use of opiate analgesic: Secondary | ICD-10-CM | POA: Diagnosis not present

## 2024-01-10 DIAGNOSIS — R918 Other nonspecific abnormal finding of lung field: Secondary | ICD-10-CM | POA: Diagnosis not present

## 2024-01-10 LAB — CBC
HCT: 39.2 % (ref 36.0–46.0)
HCT: 39.6 % (ref 36.0–46.0)
Hemoglobin: 12.4 g/dL (ref 12.0–15.0)
Hemoglobin: 13 g/dL (ref 12.0–15.0)
MCH: 32.5 pg (ref 26.0–34.0)
MCH: 33.2 pg (ref 26.0–34.0)
MCHC: 31.3 g/dL (ref 30.0–36.0)
MCHC: 33.2 g/dL (ref 30.0–36.0)
MCV: 100.3 fL — ABNORMAL HIGH (ref 80.0–100.0)
MCV: 103.9 fL — ABNORMAL HIGH (ref 80.0–100.0)
Platelets: 170 10*3/uL (ref 150–400)
Platelets: 223 10*3/uL (ref 150–400)
RBC: 3.81 MIL/uL — ABNORMAL LOW (ref 3.87–5.11)
RBC: 3.91 MIL/uL (ref 3.87–5.11)
RDW: 13.3 % (ref 11.5–15.5)
RDW: 13.5 % (ref 11.5–15.5)
WBC: 10.8 10*3/uL — ABNORMAL HIGH (ref 4.0–10.5)
WBC: 14.5 10*3/uL — ABNORMAL HIGH (ref 4.0–10.5)
nRBC: 0 % (ref 0.0–0.2)
nRBC: 0 % (ref 0.0–0.2)

## 2024-01-10 LAB — BASIC METABOLIC PANEL
Anion gap: 12 (ref 5–15)
BUN: 11 mg/dL (ref 6–20)
CO2: 22 mmol/L (ref 22–32)
Calcium: 8.8 mg/dL — ABNORMAL LOW (ref 8.9–10.3)
Chloride: 101 mmol/L (ref 98–111)
Creatinine, Ser: 0.9 mg/dL (ref 0.44–1.00)
GFR, Estimated: >60 mL/min (ref >60)
Glucose, Bld: 152 mg/dL — ABNORMAL HIGH (ref 70–99)
Potassium: 3.7 mmol/L (ref 3.5–5.1)
Sodium: 135 mmol/L (ref 135–145)

## 2024-01-10 LAB — LACTIC ACID, PLASMA: Lactic Acid, Venous: 1.1 mmol/L (ref 0.5–1.9)

## 2024-01-10 LAB — RESP PANEL BY RT-PCR (RSV, FLU A&B, COVID)  RVPGX2
Influenza A by PCR: NEGATIVE
Influenza B by PCR: NEGATIVE
Resp Syncytial Virus by PCR: NEGATIVE
SARS Coronavirus 2 by RT PCR: NEGATIVE

## 2024-01-10 LAB — C-REACTIVE PROTEIN: CRP: 21.2 mg/dL — ABNORMAL HIGH (ref <1.0)

## 2024-01-10 LAB — PROCALCITONIN: Procalcitonin: 0.33 ng/mL

## 2024-01-10 LAB — TSH: TSH: 0.572 u[IU]/mL (ref 0.350–4.500)

## 2024-01-10 LAB — GROUP A STREP BY PCR: Group A Strep by PCR: DETECTED — AB

## 2024-01-10 LAB — TROPONIN I (HIGH SENSITIVITY): Troponin I (High Sensitivity): 4 ng/L (ref <18)

## 2024-01-10 MED ORDER — FAMOTIDINE 20 MG PO TABS
20.0000 mg | ORAL_TABLET | Freq: Two times a day (BID) | ORAL | Status: DC
  Administered 2024-01-10 – 2024-01-14 (×8): 20 mg via ORAL
  Filled 2024-01-10 (×9): qty 1

## 2024-01-10 MED ORDER — SODIUM CHLORIDE 0.9 % IV SOLN
1.0000 g | Freq: Once | INTRAVENOUS | Status: AC
  Administered 2024-01-10: 1 g via INTRAVENOUS
  Filled 2024-01-10: qty 10

## 2024-01-10 MED ORDER — PANTOPRAZOLE SODIUM 40 MG PO TBEC
40.0000 mg | DELAYED_RELEASE_TABLET | Freq: Every day | ORAL | Status: DC
  Administered 2024-01-10 – 2024-01-13 (×4): 40 mg via ORAL
  Filled 2024-01-10 (×4): qty 1

## 2024-01-10 MED ORDER — ALPRAZOLAM 0.5 MG PO TABS
1.0000 mg | ORAL_TABLET | Freq: Three times a day (TID) | ORAL | Status: DC | PRN
  Administered 2024-01-10: 1 mg via ORAL
  Filled 2024-01-10: qty 2

## 2024-01-10 MED ORDER — MECLIZINE HCL 25 MG PO TABS
25.0000 mg | ORAL_TABLET | Freq: Three times a day (TID) | ORAL | Status: DC | PRN
  Administered 2024-01-10: 25 mg via ORAL
  Filled 2024-01-10: qty 1

## 2024-01-10 MED ORDER — GUAIFENESIN 100 MG/5ML PO LIQD
5.0000 mL | ORAL | Status: DC | PRN
  Administered 2024-01-10 – 2024-01-11 (×2): 5 mL via ORAL
  Filled 2024-01-10 (×2): qty 10

## 2024-01-10 MED ORDER — ONDANSETRON HCL 4 MG/2ML IJ SOLN
4.0000 mg | Freq: Once | INTRAMUSCULAR | Status: AC
  Administered 2024-01-10: 4 mg via INTRAVENOUS
  Filled 2024-01-10: qty 2

## 2024-01-10 MED ORDER — SODIUM CHLORIDE 0.9 % IV SOLN
500.0000 mg | Freq: Once | INTRAVENOUS | Status: AC
  Administered 2024-01-10: 500 mg via INTRAVENOUS
  Filled 2024-01-10: qty 5

## 2024-01-10 MED ORDER — HYDROMORPHONE HCL 1 MG/ML IJ SOLN
0.5000 mg | Freq: Once | INTRAMUSCULAR | Status: AC
  Administered 2024-01-10: 0.5 mg via INTRAVENOUS
  Filled 2024-01-10: qty 1

## 2024-01-10 MED ORDER — ACETAMINOPHEN 325 MG PO TABS
650.0000 mg | ORAL_TABLET | Freq: Four times a day (QID) | ORAL | Status: DC | PRN
  Administered 2024-01-13 – 2024-01-14 (×2): 650 mg via ORAL
  Filled 2024-01-10 (×2): qty 2

## 2024-01-10 MED ORDER — ONDANSETRON HCL 4 MG PO TABS
4.0000 mg | ORAL_TABLET | Freq: Four times a day (QID) | ORAL | Status: DC | PRN
  Administered 2024-01-13: 4 mg via ORAL
  Filled 2024-01-10: qty 1

## 2024-01-10 MED ORDER — PHENOL 1.4 % MT LIQD
1.0000 | OROMUCOSAL | Status: DC | PRN
Start: 2024-01-10 — End: 2024-01-14

## 2024-01-10 MED ORDER — ONDANSETRON HCL 4 MG/2ML IJ SOLN
4.0000 mg | Freq: Four times a day (QID) | INTRAMUSCULAR | Status: DC | PRN
Start: 2024-01-10 — End: 2024-01-14
  Administered 2024-01-12: 4 mg via INTRAVENOUS
  Filled 2024-01-10: qty 2

## 2024-01-10 MED ORDER — ENOXAPARIN SODIUM 40 MG/0.4ML IJ SOSY
40.0000 mg | PREFILLED_SYRINGE | INTRAMUSCULAR | Status: DC
  Filled 2024-01-10 (×3): qty 0.4

## 2024-01-10 MED ORDER — OXYCODONE-ACETAMINOPHEN 5-325 MG PO TABS
2.0000 | ORAL_TABLET | ORAL | Status: DC | PRN
  Administered 2024-01-10: 2 via ORAL
  Filled 2024-01-10: qty 2

## 2024-01-10 MED ORDER — SODIUM CHLORIDE 0.9 % IV BOLUS
1000.0000 mL | Freq: Once | INTRAVENOUS | Status: AC
  Administered 2024-01-10: 1000 mL via INTRAVENOUS

## 2024-01-10 MED ORDER — KETOROLAC TROMETHAMINE 30 MG/ML IJ SOLN: 30.0000 mg | Freq: Once | INTRAMUSCULAR | Status: DC

## 2024-01-10 MED ORDER — SODIUM CHLORIDE 0.9 % IV SOLN
2.0000 g | INTRAVENOUS | Status: DC
  Administered 2024-01-11 – 2024-01-14 (×4): 2 g via INTRAVENOUS
  Filled 2024-01-10 (×4): qty 20

## 2024-01-10 MED ORDER — ALBUTEROL SULFATE (2.5 MG/3ML) 0.083% IN NEBU
2.5000 mg | INHALATION_SOLUTION | RESPIRATORY_TRACT | Status: DC | PRN
  Administered 2024-01-10: 2.5 mg via RESPIRATORY_TRACT
  Filled 2024-01-10: qty 3

## 2024-01-10 MED ORDER — POTASSIUM CHLORIDE IN NACL 20-0.9 MEQ/L-% IV SOLN
INTRAVENOUS | Status: AC
  Filled 2024-01-10 (×2): qty 1000

## 2024-01-10 MED ORDER — KETOROLAC TROMETHAMINE 30 MG/ML IJ SOLN
30.0000 mg | Freq: Four times a day (QID) | INTRAMUSCULAR | Status: DC | PRN
  Administered 2024-01-10 – 2024-01-12 (×3): 30 mg via INTRAVENOUS
  Filled 2024-01-10 (×4): qty 1

## 2024-01-10 MED ORDER — SUMATRIPTAN SUCCINATE 50 MG PO TABS
100.0000 mg | ORAL_TABLET | ORAL | Status: DC | PRN
  Administered 2024-01-10 – 2024-01-14 (×5): 100 mg via ORAL
  Filled 2024-01-10 (×8): qty 2

## 2024-01-10 MED ORDER — SODIUM CHLORIDE 0.9 % IV SOLN
500.0000 mg | INTRAVENOUS | Status: DC
  Administered 2024-01-11: 500 mg via INTRAVENOUS
  Filled 2024-01-10: qty 5

## 2024-01-10 MED ORDER — ACETAMINOPHEN 650 MG RE SUPP: 650.0000 mg | Freq: Four times a day (QID) | RECTAL | Status: DC | PRN

## 2024-01-10 MED ORDER — DICYCLOMINE HCL 20 MG PO TABS
20.0000 mg | ORAL_TABLET | Freq: Three times a day (TID) | ORAL | Status: DC | PRN
  Administered 2024-01-10 – 2024-01-11 (×2): 20 mg via ORAL
  Filled 2024-01-10 (×3): qty 1

## 2024-01-10 MED ORDER — KETOROLAC TROMETHAMINE 15 MG/ML IJ SOLN
15.0000 mg | Freq: Once | INTRAMUSCULAR | Status: AC
  Administered 2024-01-10: 15 mg via INTRAVENOUS
  Filled 2024-01-10: qty 1

## 2024-01-10 MED ORDER — QUETIAPINE FUMARATE 100 MG PO TABS
400.0000 mg | ORAL_TABLET | Freq: Every day | ORAL | Status: DC
  Administered 2024-01-10 – 2024-01-13 (×4): 400 mg via ORAL
  Filled 2024-01-10 (×4): qty 4

## 2024-01-10 NOTE — ED Provider Notes (Signed)
 [facility] EMERGENCY DEPARTMENT AT [facility] Provider Note   CSN: [id number] Arrival date & time: 01/09/24  2328     History  Chief Complaint  Patient presents with   Shortness of Breath   Chest Pain    Alison Blake is a 53 y.o. female who presents with her son at the bedside with concern for 2 days of subjective fevers and chills, cough, right lower chest wall soreness, sore throat and shortness of breath.  No history of same.  HPI     Home Medications Prior to Admission medications   Medication Sig Start Date End Date Taking? Authorizing Provider  albuterol  (VENTOLIN  HFA) 108 (90 Base) MCG/ACT inhaler Inhale 2 puffs into the lungs every 4 (four) hours as needed.    [provider]  alprazolam  (NIRAVAM ) 2 MG dissolvable tablet 1 tablet    [provider]  alprazolam  (XANAX ) 2 MG tablet Take 2 mg by mouth 3 (three) times daily as needed for anxiety.     [provider]  amLODipine (NORVASC) 2.5 MG tablet Take 2.5 mg by mouth daily. 10/29/21   [provider]  amphetamine -dextroamphetamine  (ADDERALL) 30 MG tablet Take 30 mg by mouth daily as needed (for hyperactivity).     [provider]  cloNIDine  (CATAPRES ) 0.1 MG tablet Take 0.1 mg by mouth at bedtime. 12/16/21   [provider]  clotrimazole-betamethasone  (LOTRISONE) cream 1 application 11/05/21   [provider]  dicyclomine  (BENTYL ) 20 MG tablet Take 1 tablet (20 mg total) by mouth 2 (two) times daily. Patient taking differently: Take 20 mg by mouth. 3-4 times daily 10/12/19   Palumbo, April, MD  estradiol (ESTRACE) 2 MG tablet Take 2 mg by mouth at bedtime.     [provider]  famotidine  (PEPCID  AC MAXIMUM STRENGTH) 20 MG tablet 1 tablet at bedtime as needed 05/19/19   [provider]  famotidine  (PEPCID ) 20 MG tablet Take 20 mg by mouth at bedtime as needed. As needed twice daily. 02/10/20   [provider]  FLUCELVAX  QUADRIVALENT 0.5 ML injection  08/24/21   [provider]  fluconazole  (DIFLUCAN ) 150 MG tablet 1 tablet 09/23/16   [provider]  gabapentin  (NEURONTIN ) 100 MG capsule TAKE 1 CAPSULE BY MOUTH EVERYDAY AT BEDTIME 07/07/23   Magnant, Charles L, PA-C  hydrocortisone (ANUSOL-HC) 2.5 % rectal cream 1 application 12/05/19   [provider]  LATUDA 20 MG TABS tablet Take 20 mg by mouth at bedtime. 01/03/21   [provider]  levothyroxine (SYNTHROID) 50 MCG tablet Take 50 mcg by mouth every morning. 11/27/21   [provider]  meclizine  (ANTIVERT ) 25 MG tablet Take 25 mg by mouth 3 (three) times daily as needed for dizziness.    [provider]  methocarbamol (ROBAXIN) 750 MG tablet Take 750 mg by mouth every 6 (six) hours as needed. 05/07/21   [provider]  naloxone [facility]) nasal spray 4 mg/0.1 mL SMARTSIG:1-2 Spray(s) Both Nares As Directed 10/02/21   [provider]  Nutritional Supplements (ESTROVEN PO) Take 1 each by mouth daily.    [provider]  ondansetron  (ZOFRAN -ODT) 8 MG disintegrating tablet Take 8 mg by mouth as needed for nausea or vomiting.    [provider]  oxyCODONE  (OXY IR/ROXICODONE ) 5 MG immediate release tablet Take 5-10 mg by mouth every 4 (four) hours as needed. 01/02/22   [provider]  oxyCODONE -acetaminophen  (PERCOCET) 10-325 MG per tablet Take 1 tablet by  mouth every 4 (four) hours as needed for pain.     [provider]  oxyCODONE -acetaminophen  (PERCOCET) 10-325 MG tablet Take 1 tablet by mouth every 4 (four) hours as needed for pain. 08/25/22     oxyCODONE -acetaminophen  (PERCOCET) 10-325 MG tablet Take 1 tablet by mouth every 4 (four) hours as needed for pain. 10/22/22     oxyCODONE -acetaminophen  (PERCOCET) 10-325 MG tablet Take 1 tablet by mouth every 4 (four) hours as needed for pain. 12/24/22     oxyCODONE -acetaminophen  (PERCOCET) 10-325 MG tablet Take 1 tablet by mouth  every 4 (four) hours as needed for pain. 02/24/23     oxyCODONE -acetaminophen  (PERCOCET) 10-325 MG tablet Take 1 tablet by mouth every 4 (four) hours as needed for pain 04/23/23     oxyCODONE -acetaminophen  (PERCOCET) 10-325 MG tablet Take 1 tablet by mouth every 4 hours as needed for pain 04/23/23     oxyCODONE -acetaminophen  (PERCOCET) 10-325 MG tablet Take 1 tablet by mouth every 4 (four) hours as needed for pain. 07/21/23     oxyCODONE -acetaminophen  (PERCOCET) 10-325 MG tablet Take 1 tablet by mouth every 4 (four) hours as needed for pain. 06/22/23     oxyCODONE -acetaminophen  (PERCOCET) 10-325 MG tablet Take 1 tablet by mouth every four hours as needed for pain 08/21/23     oxyCODONE -acetaminophen  (PERCOCET) 10-325 MG tablet Take 1 tablet by mouth every 4 (four) hours as needed for pain 09/19/23     oxyCODONE -acetaminophen  (PERCOCET) 10-325 MG tablet Take 1 tablet by mouth every 4 hours as needed for pain 11/20/23 11/20/23     oxyCODONE -acetaminophen  (PERCOCET) 10-325 MG tablet Take 1 tablet by mouth every 4 (four) hours as needed for pain 10/22/23     oxyCODONE -acetaminophen  (PERCOCET) 10-325 MG tablet Take 1 tablet by mouth every 4 (four) hours as needed for pain. 01/16/24     oxyCODONE -acetaminophen  (PERCOCET) 10-325 MG tablet Take 1 tablet by mouth every 4 (four) hours as needed for pain. 12/19/23     pantoprazole  (PROTONIX ) 40 MG tablet Take 40 mg by mouth at bedtime. 02/10/20   [provider]  promethazine -dextromethorphan  (PROMETHAZINE -DM) 6.25-15 MG/5ML syrup SMARTSIG:5-10 Milliliter(s) By Mouth Every 6 Hours PRN 10/04/21   [provider]  SUMAtriptan  (IMITREX ) 100 MG tablet TAKE 1 TABLET BY MOUTH ONCE DAILY IF NEEDED FOR MIGRAINES 30    [provider]  tiZANidine  (ZANAFLEX ) 2 MG tablet Take 1 tablet (2 mg total) by mouth 3 (three) times daily. 11/14/22     tiZANidine  (ZANAFLEX ) 2 MG tablet Take 1 tablet (2 mg total) by mouth 3 (three) times daily. 12/24/22      valACYclovir (VALTREX) 1000 MG tablet 2 tablet 04/12/15   [provider]  vitamin B-12 (CYANOCOBALAMIN) 1000 MCG tablet Take 3,000 mcg by mouth daily.    [provider]      Allergies    Cefdinir and Penicillins    Review of Systems   Review of Systems  Constitutional:  Positive for appetite change, chills, fatigue and fever.  HENT:  Positive for sore throat.   Respiratory:  Positive for cough and shortness of breath.   Cardiovascular:  Positive for chest pain.  Gastrointestinal:  Positive for nausea.    Physical Exam Updated Vital Signs BP (!) 90/55   Pulse 77   Temp 100.3 F (37.9 C) (Oral)   Resp (!) 22   Wt 99.8 kg   SpO2 90%   BMI 34.46 kg/m  Physical Exam Vitals and nursing note reviewed.  Constitutional:  Appearance: She is not ill-appearing or toxic-appearing.  HENT:     Head: Normocephalic and atraumatic.     Mouth/Throat:     Mouth: Mucous membranes are moist.     Pharynx: Oropharynx is clear. Uvula midline. Posterior oropharyngeal erythema present. No oropharyngeal exudate.     Tonsils: No tonsillar exudate.  Eyes:     General:        Right eye: No discharge.        Left eye: No discharge.     Extraocular Movements: Extraocular movements intact.     Conjunctiva/sclera: Conjunctivae normal.     Pupils: Pupils are equal, round, and reactive to light.  Cardiovascular:     Rate and Rhythm: Normal rate and regular rhythm.     Pulses: Normal pulses.  Pulmonary:     Effort: Pulmonary effort is normal. No respiratory distress.     Breath sounds: Examination of the right-lower field reveals rhonchi. Rhonchi present. No wheezing or rales.     Comments: Hypoxic to 85% on room air with good waveform on pulse oximetry at time of my evaluation. Chest:     Chest wall: No mass, tenderness or edema.  Abdominal:     General: Bowel sounds are normal. There is no distension.     Tenderness: There is no abdominal tenderness.  Musculoskeletal:         General: No deformity.     Cervical back: Neck supple.     Right lower leg: No edema.     Left lower leg: No edema.  Skin:    General: Skin is warm and dry.     Capillary Refill: Capillary refill takes less than 2 seconds.  Neurological:     General: No focal deficit present.     Mental Status: She is alert and oriented to person, place, and time. Mental status is at baseline.  Psychiatric:        Mood and Affect: Mood normal.     ED Results / Procedures / Treatments   Labs (all labs ordered are listed, but only abnormal results are displayed) Labs Reviewed  GROUP A STREP BY PCR - Abnormal; Notable for the following components:      Result Value   Group A Strep by PCR DETECTED (*)    All other components within normal limits  BASIC METABOLIC PANEL - Abnormal; Notable for the following components:   Glucose, Bld 152 (*)    Calcium 8.8 (*)    All other components within normal limits  CBC - Abnormal; Notable for the following components:   WBC 14.5 (*)    MCV 100.3 (*)    All other components within normal limits  RESP PANEL BY RT-PCR (RSV, FLU A&B, COVID)  [id number]  CULTURE, BLOOD (ROUTINE X 2)  CULTURE, BLOOD (ROUTINE X 2)  EXPECTORATED SPUTUM ASSESSMENT W GRAM STAIN, RFLX TO RESP C  PROCALCITONIN  LEGIONELLA PNEUMOPHILA SEROGP 1 UR AG  STREP PNEUMONIAE URINARY ANTIGEN  TROPONIN I (HIGH SENSITIVITY)    EKG EKG Interpretation Date/Time:  Saturday January 09 2024 23:45:33 EST Ventricular Rate:  104 PR Interval:  100 QRS Duration:  92 QT Interval:  319 QTC Calculation: 420 R Axis:   54  Text Interpretation: Sinus tachycardia RSR' in V1 or V2, probably normal variant Borderline repolarization abnormality Confirmed by Griselda Norris [phone]) on 01/10/2024 12:00:20 AM  Radiology DG Chest 2 View Result Date: 01/10/2024 CLINICAL DATA:  Cough and congestion. EXAM: CHEST - 2 VIEW COMPARISON:  December 04, 2022 FINDINGS: The heart size and mediastinal contours are within  normal limits. Mild atelectasis and/or infiltrate is seen within the right lung base. Very mild atelectatic changes are also noted within the of periphery of the left lung base. No pleural effusion or pneumothorax is identified. Radiopaque surgical clips are seen within the right upper quadrant. The visualized skeletal structures are unremarkable. IMPRESSION: Mild right basilar atelectasis and/or infiltrate. Electronically Signed   By: Suzen Dials M.D.   On: 01/10/2024 00:15    Procedures .Critical Care  Performed by: Bobette Pleasant SAUNDERS, PA-C Authorized by: Bobette Pleasant SAUNDERS, PA-C   Critical care provider statement:    Critical care time (minutes):  45   Critical care was time spent personally by me on the following activities:  Development of treatment plan with patient or surrogate, discussions with consultants, evaluation of patient's response to treatment, examination of patient, obtaining history from patient or surrogate, ordering and performing treatments and interventions, ordering and review of laboratory studies, ordering and review of radiographic studies, pulse oximetry and re-evaluation of patient's condition     Medications Ordered in ED Medications  cefTRIAXone  (ROCEPHIN ) 1 g in sodium chloride  0.9 % 100 mL IVPB (1 g Intravenous New Bag/Given 01/10/24 0256)  azithromycin  (ZITHROMAX ) 500 mg in sodium chloride  0.9 % 250 mL IVPB (has no administration in time range)  guaiFENesin  (ROBITUSSIN) 100 MG/5ML liquid 5 mL (has no administration in time range)  albuterol  (PROVENTIL ) (2.5 MG/3ML) 0.083% nebulizer solution 2.5 mg (has no administration in time range)  ondansetron  (ZOFRAN ) injection 4 mg (4 mg Intravenous Given 01/10/24 0248)  sodium chloride  0.9 % bolus 1,000 mL (1,000 mLs Intravenous New Bag/Given 01/10/24 0254)  ketorolac  (TORADOL ) 15 MG/ML injection 15 mg (15 mg Intravenous Given 01/10/24 0248)    ED Course/ Medical Decision Making/ A&P Clinical Course as of  01/10/24 0301  Sun Jan 10, 2024  0217 Per patient, no reaction to cephalosporins.  [RS]  [phone] Consult to Dr. Laveda, hospitalist, who is agreeable to admitting this patient to her service. I appreciate her collaboration.  [RS]    Clinical Course User Index [RS] Bobette Pleasant SAUNDERS, PA-C                                 Medical Decision Making 53 year old female with shortness of breath, sore throat, fevers and chills.  Tachypneic on intake, hypoxic to my arrival to the bedside.  Low normal blood pressures.  Cardiopulmonary exam as above with rhonchi of the right lower lobe.  No lower extremity edema, abdominal exam is benign.  Posterior pharyngeal erythema without exudate or evidence of oropharyngeal abscess.  DDx includes not limited to CHF, PE, pleural effusion, ACS, pneumonia, pneumothorax, reactive airway disease.  Amount and/or Complexity of Data Reviewed Labs: ordered.    Details: CBC leukocytosis 14, BMP unremarkable, troponin negative, RVP negative, strep test positive. Radiology: ordered and independent interpretation performed.    Details: Chest with right lower lobe infiltrate  Risk Prescription drug management. Decision regarding hospitalization.    Patient will require mission to the hospital for hypoxia secondary to community-acquired pneumonia requiring 2 L of oxygen by nasal cannula.  ED antibiotics administered, patient denies any history of reaction to cephalosporins.  Itchy rash to penicillins in the past.  Ailey and her son voiced understanding of her medical evaluation and treatment plan. Each of their questions answered to their expressed satisfaction.  She is amenable  to plan for admission.  This chart was dictated using voice recognition software, Dragon. Despite the best efforts of this provider to proofread and correct errors, errors may still occur which can change documentation meaning.        Final Clinical Impression(s) / ED Diagnoses Final  diagnoses:  Hypoxia  Community acquired pneumonia of right lower lobe of lung  Strep throat    Rx / DC Orders ED Discharge Orders     None         Bobette Pleasant JONELLE DEVONNA 01/10/24 0301    Griselda Norris, MD 01/10/24 [phone]

## 2024-01-10 NOTE — H&P (Addendum)
 PCP:   Okey Carlin Redbird, MD   Chief Complaint:  Right-sided chest pain, throat pain  HPI: This is a 53 year old female with past medical history of bipolar, hypothyroidism, hypertension, ADHD, GERD, migraines.  On Wednesday patient developed right-sided chest pains.  She also noted her skin was red on the face and chest.  On Thursday and Friday her chest pain became progressively worse.  Worse with breath.  She developed a dry cough.  On Saturday she woke with chest pain that was severe.  She then developed shortness of breath.  This morning when she woke, she was very very dizzy.  She felt as though her skin was on fire and had really bad cold chills.  She had to crawl to the bathroom because of her dizziness.  She crawled back to bed, the more she laid there the more she hurt.  She states she hurt in her chest, back, throat, head and right side.  Per patient she has not eaten in 2 days because of the severity of her throat pain.  She came to the ER  In the ER patient's initial BP 111/77, eventually dipping to 90/54.  1 L NS bolus given.  Tmax 100.3.  Patient hypoxic to 86%, on 2 L oxygen, satting 95%.  WBCs 14.5, CXR shows mild right hilar atelectasis and/or infiltrate.  Strep throat positive.  Respiratory panel negative.  Blood cultures x 2 collected.  Patient given IV Rocephin  and azithromycin .  Admission requested.  Review of Systems:  Per HPI  Past Medical History: Past Medical History:  Diagnosis Date   Anxiety    Bipolar 1 disorder (HCC)    Depression    Fibromyalgia    IBS (irritable bowel syndrome)    Interstitial cystitis    Migraine    Multiple personality disorder (HCC)    Panic attacks    Past Surgical History:  Procedure Laterality Date   ABDOMINAL HYSTERECTOMY     APPENDECTOMY     BLADDER SURGERY     hyperextension surgey   BREAST LUMPECTOMY Right    benign   CHOLECYSTECTOMY     laproscopy     x 7    Medications: Prior to Admission medications   Medication  Sig Start Date End Date Taking? Authorizing Provider  albuterol  (VENTOLIN  HFA) 108 (90 Base) MCG/ACT inhaler Inhale 2 puffs into the lungs every 4 (four) hours as needed.    [provider]  alprazolam  (NIRAVAM ) 2 MG dissolvable tablet 1 tablet    [provider]  alprazolam  (XANAX ) 2 MG tablet Take 2 mg by mouth 3 (three) times daily as needed for anxiety.     [provider]  amLODipine (NORVASC) 2.5 MG tablet Take 2.5 mg by mouth daily. 10/29/21   [provider]  amphetamine -dextroamphetamine  (ADDERALL) 30 MG tablet Take 30 mg by mouth daily as needed (for hyperactivity).     [provider]  cloNIDine  (CATAPRES ) 0.1 MG tablet Take 0.1 mg by mouth at bedtime. 12/16/21   [provider]  clotrimazole-betamethasone  (LOTRISONE) cream 1 application 11/05/21   [provider]  dicyclomine  (BENTYL ) 20 MG tablet Take 1 tablet (20 mg total) by mouth 2 (two) times daily. Patient taking differently: Take 20 mg by mouth. 3-4 times daily 10/12/19   Palumbo, April, MD  estradiol (ESTRACE) 2 MG tablet Take 2 mg by mouth at bedtime.     [provider]  famotidine  (PEPCID  AC MAXIMUM STRENGTH) 20 MG tablet 1 tablet at bedtime as  needed 05/19/19   [provider]  famotidine  (PEPCID ) 20 MG tablet Take 20 mg by mouth at bedtime as needed. As needed twice daily. 02/10/20   [provider]  FLUCELVAX QUADRIVALENT 0.5 ML injection  08/24/21   [provider]  fluconazole  (DIFLUCAN ) 150 MG tablet 1 tablet 09/23/16   [provider]  gabapentin  (NEURONTIN ) 100 MG capsule TAKE 1 CAPSULE BY MOUTH EVERYDAY AT BEDTIME 07/07/23   Magnant, Charles L, PA-C  hydrocortisone (ANUSOL-HC) 2.5 % rectal cream 1 application 12/05/19   [provider]  LATUDA 20 MG TABS tablet Take 20 mg by mouth at bedtime. 01/03/21   [provider]  levothyroxine (SYNTHROID) 50 MCG tablet Take 50 mcg by mouth every morning. 11/27/21    [provider]  meclizine  (ANTIVERT ) 25 MG tablet Take 25 mg by mouth 3 (three) times daily as needed for dizziness.    [provider]  methocarbamol (ROBAXIN) 750 MG tablet Take 750 mg by mouth every 6 (six) hours as needed. 05/07/21   [provider]  naloxone [facility]) nasal spray 4 mg/0.1 mL SMARTSIG:1-2 Spray(s) Both Nares As Directed 10/02/21   [provider]  Nutritional Supplements (ESTROVEN PO) Take 1 each by mouth daily.    [provider]  ondansetron  (ZOFRAN -ODT) 8 MG disintegrating tablet Take 8 mg by mouth as needed for nausea or vomiting.    [provider]  oxyCODONE  (OXY IR/ROXICODONE ) 5 MG immediate release tablet Take 5-10 mg by mouth every 4 (four) hours as needed. 01/02/22   [provider]  oxyCODONE -acetaminophen  (PERCOCET) 10-325 MG per tablet Take 1 tablet by mouth every 4 (four) hours as needed for pain.     [provider]  oxyCODONE -acetaminophen  (PERCOCET) 10-325 MG tablet Take 1 tablet by mouth every 4 (four) hours as needed for pain. 08/25/22     oxyCODONE -acetaminophen  (PERCOCET) 10-325 MG tablet Take 1 tablet by mouth every 4 (four) hours as needed for pain. 10/22/22     oxyCODONE -acetaminophen  (PERCOCET) 10-325 MG tablet Take 1 tablet by mouth every 4 (four) hours as needed for pain. 12/24/22     oxyCODONE -acetaminophen  (PERCOCET) 10-325 MG tablet Take 1 tablet by mouth every 4 (four) hours as needed for pain. 02/24/23     oxyCODONE -acetaminophen  (PERCOCET) 10-325 MG tablet Take 1 tablet by mouth every 4 (four) hours as needed for pain 04/23/23     oxyCODONE -acetaminophen  (PERCOCET) 10-325 MG tablet Take 1 tablet by mouth every 4 hours as needed for pain 04/23/23     oxyCODONE -acetaminophen  (PERCOCET) 10-325 MG tablet Take 1 tablet by mouth every 4 (four) hours as needed for pain. 07/21/23     oxyCODONE -acetaminophen  (PERCOCET) 10-325 MG tablet Take 1 tablet by mouth every 4 (four) hours as needed for  pain. 06/22/23     oxyCODONE -acetaminophen  (PERCOCET) 10-325 MG tablet Take 1 tablet by mouth every four hours as needed for pain 08/21/23     oxyCODONE -acetaminophen  (PERCOCET) 10-325 MG tablet Take 1 tablet by mouth every 4 (four) hours as needed for pain 09/19/23     oxyCODONE -acetaminophen  (PERCOCET) 10-325 MG tablet Take 1 tablet by mouth every 4 hours as needed for pain 11/20/23 11/20/23     oxyCODONE -acetaminophen  (PERCOCET) 10-325 MG tablet Take 1 tablet by mouth every 4 (four) hours as needed for pain 10/22/23     oxyCODONE -acetaminophen  (PERCOCET) 10-325 MG tablet Take 1 tablet by mouth every 4 (four) hours as needed for pain. 01/16/24     oxyCODONE -acetaminophen  (PERCOCET) 10-325 MG tablet  Take 1 tablet by mouth every 4 (four) hours as needed for pain. 12/19/23     pantoprazole  (PROTONIX ) 40 MG tablet Take 40 mg by mouth at bedtime. 02/10/20   [provider]  promethazine -dextromethorphan  (PROMETHAZINE -DM) 6.25-15 MG/5ML syrup SMARTSIG:5-10 Milliliter(s) By Mouth Every 6 Hours PRN 10/04/21   [provider]  SUMAtriptan  (IMITREX ) 100 MG tablet TAKE 1 TABLET BY MOUTH ONCE DAILY IF NEEDED FOR MIGRAINES 30    [provider]  tiZANidine  (ZANAFLEX ) 2 MG tablet Take 1 tablet (2 mg total) by mouth 3 (three) times daily. 11/14/22     tiZANidine  (ZANAFLEX ) 2 MG tablet Take 1 tablet (2 mg total) by mouth 3 (three) times daily. 12/24/22     valACYclovir (VALTREX) 1000 MG tablet 2 tablet 04/12/15   [provider]  vitamin B-12 (CYANOCOBALAMIN) 1000 MCG tablet Take 3,000 mcg by mouth daily.    [provider]    Allergies:   Allergies  Allergen Reactions   Cefdinir Hives   Penicillins Hives and Other (See Comments)    Has patient had a PCN reaction causing immediate rash, facial/tongue/throat swelling, SOB or lightheadedness with hypotension: No Has patient had a PCN reaction causing severe rash involving mucus membranes or skin necrosis: No Has patient  had a PCN reaction that required hospitalization No Has patient had a PCN reaction occurring within the last 10 years: No If all of the above answers are NO, then may proceed with Cephalosporin use.    Social History:  reports that she quit smoking about 9 years ago. Her smoking use included cigarettes. She has never used smokeless tobacco. She reports that she does not currently use alcohol. She reports that she does not use drugs.  Family History: Family History  Problem Relation Age of Onset   Hypertension Mother    Diabetes Mother    Nephrolithiasis Mother    Neuropathy Mother    CAD Father 35   Hypertension Father    Nephrolithiasis Father    Nephrolithiasis Sister     Physical Exam: Vitals:   01/09/24 2352 01/09/24 2353 01/10/24 0136 01/10/24 0200  BP: 111/77  (!) 90/54 (!) 90/55  Pulse: 99  76 77  Resp: (!) 25  (!) 22   Temp: 100.3 F (37.9 C)     TempSrc: Oral     SpO2: 92%  92% 90%  Weight:  99.8 kg      General:  A&Ox3, well developed and nourished, no acute distress Eyes: No scleral icterus ENT: Moist oral mucosa, no erythema, no exudates Lungs: CTA B/L, no use of accessory muscles.  Reproducible right-sided chest wall pain  Cardiovascular: RRR, no murmurs. No carotid bruits, no JVD Abdomen: soft, positive BS, NTND, no organomegaly, not an acute abdomen GU: not examined Neuro: CN II - XII grossly intact, sensation intact Musculoskeletal: strength 5/5 all extremities, no edema Skin: no rash, no subcutaneous crepitation, no decubitus Psych: appropriate patient   Labs on Admission:  Recent Labs    01/10/24 0000  NA 135  K 3.7  CL 101  CO2 22  GLUCOSE 152*  BUN 11  CREATININE 0.90  CALCIUM 8.8*   Recent Labs    01/10/24 0000  WBC 14.5*  HGB 13.0  HCT 39.2  MCV 100.3*  PLT 223     Micro Results: Recent Results (from the past 240 hours)  Resp panel by RT-PCR (RSV, Flu A&B, Covid) Throat     Status: None   Collection Time: 01/10/24 12:14  AM   Specimen: Throat; Nasal Swab  Result Value Ref Range Status   SARS Coronavirus 2 by RT PCR NEGATIVE NEGATIVE Final    Comment: (NOTE) SARS-CoV-2 target nucleic acids are NOT DETECTED.  The SARS-CoV-2 RNA is generally detectable in upper respiratory specimens during the acute phase of infection. The lowest concentration of SARS-CoV-2 viral copies this assay can detect is 138 copies/mL. A negative result does not preclude SARS-Cov-2 infection and should not be used as the sole basis for treatment or other patient management decisions. A negative result may occur with  improper specimen collection/handling, submission of specimen other than nasopharyngeal swab, presence of viral mutation(s) within the areas targeted by this assay, and inadequate number of viral copies(<138 copies/mL). A negative result must be combined with clinical observations, patient history, and epidemiological information. The expected result is Negative.  Fact Sheet for Patients:  bloggercourse.com  Fact Sheet for Healthcare Providers:  seriousbroker.it  This test is no t yet approved or cleared by the [place]  FDA and  has been authorized for detection and/or diagnosis of SARS-CoV-2 by FDA under an Emergency Use Authorization (EUA). This EUA will remain  in effect (meaning this test can be used) for the duration of the COVID-19 declaration under Section 564(b)(1) of the Act, 21 U.S.C.section 360bbb-3(b)(1), unless the authorization is terminated  or revoked sooner.       Influenza A by PCR NEGATIVE NEGATIVE Final   Influenza B by PCR NEGATIVE NEGATIVE Final    Comment: (NOTE) The Xpert Xpress SARS-CoV-2/FLU/RSV plus assay is intended as an aid in the diagnosis of influenza from Nasopharyngeal swab specimens and should not be used as a sole basis for treatment. Nasal washings and aspirates are unacceptable for Xpert Xpress  SARS-CoV-2/FLU/RSV testing.  Fact Sheet for Patients: bloggercourse.com  Fact Sheet for Healthcare Providers: seriousbroker.it  This test is not yet approved or cleared by the [place]  FDA and has been authorized for detection and/or diagnosis of SARS-CoV-2 by FDA under an Emergency Use Authorization (EUA). This EUA will remain in effect (meaning this test can be used) for the duration of the COVID-19 declaration under Section 564(b)(1) of the Act, 21 U.S.C. section 360bbb-3(b)(1), unless the authorization is terminated or revoked.     Resp Syncytial Virus by PCR NEGATIVE NEGATIVE Final    Comment: (NOTE) Fact Sheet for Patients: bloggercourse.com  Fact Sheet for Healthcare Providers: seriousbroker.it  This test is not yet approved or cleared by the [place]  FDA and has been authorized for detection and/or diagnosis of SARS-CoV-2 by FDA under an Emergency Use Authorization (EUA). This EUA will remain in effect (meaning this test can be used) for the duration of the COVID-19 declaration under Section 564(b)(1) of the Act, 21 U.S.C. section 360bbb-3(b)(1), unless the authorization is terminated or revoked.  Performed at [facility], [address], [place], [place] [address]   Group A Strep by PCR     Status: Abnormal   Collection Time: 01/10/24 12:15 AM   Specimen: Throat; Sterile Swab  Result Value Ref Range Status   Group A Strep by PCR DETECTED (A) NOT DETECTED Final    Comment: Performed at [facility], [address] [place], [place] [address]     Radiological Exams on Admission: DG Chest 2 View Result Date: 01/10/2024 CLINICAL DATA:  Cough and congestion. EXAM: CHEST - 2 VIEW COMPARISON:  December 04, 2022 FINDINGS: The heart size and mediastinal contours are within normal limits. Mild atelectasis and/or  infiltrate is  seen within the right lung base. Very mild atelectatic changes are also noted within the of periphery of the left lung base. No pleural effusion or pneumothorax is identified. Radiopaque surgical clips are seen within the right upper quadrant. The visualized skeletal structures are unremarkable. IMPRESSION: Mild right basilar atelectasis and/or infiltrate. Electronically Signed   By: Suzen Dials M.D.   On: 01/10/2024 00:15    Assessment/Plan Present on Admission:  Community-acquired pneumonia //  Strep throat  Acute respiratory failure with hypoxia -Pneumonia order set initiated -Blood cultures x 2 collected in ER -Continue IV Rocephin  and azithromycin  -Chloraseptic spray as needed -On 2 L oxygen, nebulizers as needed -Toradol  PRN Pain -IV fluid hydration -Robitussin as needed   Hypothyroidism -Does not take Synthroid.  States it makes her feels back   Bipolar disease  HTN  Migraine -Patient's son to bring her home medications.   Xavior Niazi 01/10/2024, 2:43 AM

## 2024-01-10 NOTE — ED Notes (Signed)
 Patient ambulated to bathroom.

## 2024-01-10 NOTE — ED Notes (Signed)
 Pt was given ginger ale and saltines and crackers. States that she has not eaten in 3 days, Breakfast and lunch has been given today. Pt has not touch them.

## 2024-01-10 NOTE — ED Notes (Signed)
 Patient stated that she had pain, offered PRN pain meds and throat spray and patient refused.

## 2024-01-10 NOTE — ED Notes (Signed)
 Hooked patient back to monitor, Pt seemed upset stating that she feels very well and in a lot of pain and that she has not got anything. I made her aware that if she wanted pain meds that she had prn pain meds. Pt went ahead and wanted the toradol  but refuse the throat spray, stating that it was not going to do anything away.

## 2024-01-10 NOTE — Progress Notes (Signed)
 PROGRESS NOTE Alison Blake  FMW:[id number] DOB: May 21, 1971 DOA: 01/09/2024 PCP: Okey Carlin Redbird, MD  Brief Narrative/Hospital Course: 63  yof w/ history of bipolar disorder, hypothyroidism, hypertension, ADHD, GERD, migraines who developed some right-sided chest pain, facial flushing on last Wednesday which became progressively worse including chest pain over the coming days worse with breathing.  She also developed fever cough and she woke up becoming dizzy and she had to crawl to the bathroom because of her dizziness.  Endorsed not eating much past 2 days In the ED:BP 111/77, eventually dipping to 90/54.  1 L NS bolus given.  Tmax 100.3.  Patient hypoxic to 86%, on 2 L O2 spo2 95%.WBCs 14.5 CXR>>mild right hilar atelectasis and/or infiltrate.  Strep throat positive.  Respiratory panel negative.  Blood cultures x 2 collected and admitted    Subjective: Patient complains of ongoing throat discomfort Blood pressure has been soft Repeat labs pending including lactic acid  Assessment and Plan: Principal Problem:   CAP (community acquired pneumonia) Active Problems:   Strep throat   HTN (hypertension)   Hypothyroidism   Bipolar disease, chronic (HCC)   ADHD   GERD (gastroesophageal reflux disease)   Community acquired pneumonia of both lungs  Strep throat pharyngitis CAP Sepsis POA: Patient with SIRS criteria with leukocytosis thought to be PE fever in the setting of strep throat and community-acquired pneumonia. Continue ceftriaxone  azithromycin , supplemental oxygen monitor vitals.  BP soft in high 80s-will check lactic acid repeat CBC BMP 1 L bolus NS Recent Labs  Lab 01/10/24 0000  WBC 14.5*    Acute hypoxic respiratory failure: Needing supplemental oxygen at 2 L saturating 95% secondary to #1.Slowly weaned off oxygen continue ambulation I-S  Bipolar disorder Migraine history: Mood stable, resume bedtime Seroquel , and prn meds  Hypothyroidism history: Has not taken her  Synthroid in months check TSH  Hypertension: Not on medication.  Obesity: Patient's Body mass index is 34.46 kg/m. : Will benefit with PCP follow-up, weight loss  healthy lifestyle and outpatient sleep evaluation.   DVT prophylaxis: enoxaparin  (LOVENOX ) injection 40 mg Start: 01/10/24 0600 Code Status:   Code Status: Full Code Family Communication: plan of care discussed with patient at bedside. Patient status is: Remains hospitalized because of severity of illness Level of care: Med-Surg   Dispo: The patient is from: home            Anticipated disposition: TBD Objective: Vitals last 24 hrs: Vitals:   01/10/24 0330 01/10/24 0400 01/10/24 0445 01/10/24 0748  BP: 120/77 103/82 (!) 92/58 (!) 88/53  Pulse: 92 79 73 78  Resp: 20  18 16   Temp:   97.9 F (36.6 C) 98.4 F (36.9 C)  TempSrc:   Oral Oral  SpO2: 94% 96% 95% 94%  Weight:       Weight change:   Physical Examination: General exam: alert awake,at baseline, older than stated age HEENT:Oral mucosa moist, Ear/Nose WNL grossly Respiratory system: Bilaterally clear BS,no use of accessory muscle Cardiovascular system: S1 & S2 +, No JVD. Gastrointestinal system: Abdomen soft,NT,ND, BS+ Nervous System: Alert, awake, moving all extremities,and following commands. Extremities: LE edema neg,distal peripheral pulses palpable and warm.  Skin: No rashes,no icterus. MSK: Normal muscle bulk,tone, power   Medications reviewed:  Scheduled Meds:  enoxaparin  (LOVENOX ) injection  40 mg Subcutaneous Q24H   Continuous Infusions:  0.9 % NaCl with KCl 20 mEq / L     [START ON 01/11/2024] azithromycin      [START ON 01/11/2024] cefTRIAXone  (ROCEPHIN )  IV      Diet Order             Diet Heart Room service appropriate? Yes; Fluid consistency: Thin  Diet effective now                  No intake or output data in the 24 hours ending 01/10/24 0808 Net IO Since Admission: No IO data has been entered for this period [01/10/24 0808]   Wt Readings from Last 3 Encounters:  01/09/24 99.8 kg  12/04/22 99.8 kg  02/05/22 102.1 kg     Unresulted Labs (From admission, onward)     Start     Ordered   01/17/24 0500  Creatinine, serum  (enoxaparin  (LOVENOX )    CrCl >/= 30 ml/min)  Weekly,   R     Comments: while on enoxaparin  therapy    01/10/24 0548   01/11/24 0500  Basic metabolic panel  Tomorrow morning,   R        01/10/24 0548   01/11/24 0500  CBC with Differential/Platelet  Tomorrow morning,   R        01/10/24 0548   01/10/24 0501  C-reactive protein  Once,   R        01/10/24 0500   01/10/24 0500  Procalcitonin  Tomorrow morning,   R       References:    Procalcitonin Lower Respiratory Tract Infection AND Sepsis Procalcitonin Algorithm   01/10/24 0246   01/10/24 0248  Legionella Pneumophila Serogp 1 Ur Ag  Once,   URGENT        01/10/24 0250   01/10/24 0248  Strep pneumoniae urinary antigen  Once,   URGENT        01/10/24 0250   01/10/24 0248  Expectorated Sputum Assessment w Gram Stain, Rflx to Resp Cult  Once,   R        01/10/24 0250   01/10/24 0239  Culture, blood (Routine X 2) w Reflex to ID Panel  BLOOD CULTURE X 2,   R (with STAT occurrences)      01/10/24 0238          Data Reviewed: I have personally reviewed following labs and imaging studies CBC: Recent Labs  Lab 01/10/24 0000  WBC 14.5*  HGB 13.0  HCT 39.2  MCV 100.3*  PLT 223   Basic Metabolic Panel:  Recent Labs  Lab 01/10/24 0000  NA 135  K 3.7  CL 101  CO2 22  GLUCOSE 152*  BUN 11  CREATININE 0.90  CALCIUM 8.8*   GFR: CrCl cannot be calculated (Unknown ideal weight.). Sepsis Labs: No results for input(s): PROCALCITON, LATICACIDVEN in the last 168 hours. Recent Results (from the past 240 hours)  Resp panel by RT-PCR (RSV, Flu A&B, Covid) Throat     Status: None   Collection Time: 01/10/24 12:14 AM   Specimen: Throat; Nasal Swab  Result Value Ref Range Status   SARS Coronavirus 2 by RT PCR NEGATIVE NEGATIVE Final     Comment: (NOTE) SARS-CoV-2 target nucleic acids are NOT DETECTED.  The SARS-CoV-2 RNA is generally detectable in upper respiratory specimens during the acute phase of infection. The lowest concentration of SARS-CoV-2 viral copies this assay can detect is 138 copies/mL. A negative result does not preclude SARS-Cov-2 infection and should not be used as the sole basis for treatment or other patient management decisions. A negative result may occur with  improper specimen collection/handling, submission of specimen other  than nasopharyngeal swab, presence of viral mutation(s) within the areas targeted by this assay, and inadequate number of viral copies(<138 copies/mL). A negative result must be combined with clinical observations, patient history, and epidemiological information. The expected result is Negative.  Fact Sheet for Patients:  bloggercourse.com  Fact Sheet for Healthcare Providers:  seriousbroker.it  This test is no t yet approved or cleared by the [place]  FDA and  has been authorized for detection and/or diagnosis of SARS-CoV-2 by FDA under an Emergency Use Authorization (EUA). This EUA will remain  in effect (meaning this test can be used) for the duration of the COVID-19 declaration under Section 564(b)(1) of the Act, 21 U.S.C.section 360bbb-3(b)(1), unless the authorization is terminated  or revoked sooner.       Influenza A by PCR NEGATIVE NEGATIVE Final   Influenza B by PCR NEGATIVE NEGATIVE Final    Comment: (NOTE) The Xpert Xpress SARS-CoV-2/FLU/RSV plus assay is intended as an aid in the diagnosis of influenza from Nasopharyngeal swab specimens and should not be used as a sole basis for treatment. Nasal washings and aspirates are unacceptable for Xpert Xpress SARS-CoV-2/FLU/RSV testing.  Fact Sheet for Patients: bloggercourse.com  Fact Sheet for Healthcare  Providers: seriousbroker.it  This test is not yet approved or cleared by the [place]  FDA and has been authorized for detection and/or diagnosis of SARS-CoV-2 by FDA under an Emergency Use Authorization (EUA). This EUA will remain in effect (meaning this test can be used) for the duration of the COVID-19 declaration under Section 564(b)(1) of the Act, 21 U.S.C. section 360bbb-3(b)(1), unless the authorization is terminated or revoked.     Resp Syncytial Virus by PCR NEGATIVE NEGATIVE Final    Comment: (NOTE) Fact Sheet for Patients: bloggercourse.com  Fact Sheet for Healthcare Providers: seriousbroker.it  This test is not yet approved or cleared by the [place]  FDA and has been authorized for detection and/or diagnosis of SARS-CoV-2 by FDA under an Emergency Use Authorization (EUA). This EUA will remain in effect (meaning this test can be used) for the duration of the COVID-19 declaration under Section 564(b)(1) of the Act, 21 U.S.C. section 360bbb-3(b)(1), unless the authorization is terminated or revoked.  Performed at [facility], [address] [place], [place] [address]   Group A Strep by PCR     Status: Abnormal   Collection Time: 01/10/24 12:15 AM   Specimen: Throat; Sterile Swab  Result Value Ref Range Status   Group A Strep by PCR DETECTED (A) NOT DETECTED Final    Comment: Performed at [facility], [address], [place], [place] [address]    Antimicrobials/Microbiology: Anti-infectives (From admission, onward)    Start     Dose/Rate Route Frequency Ordered Stop   01/11/24 0230  cefTRIAXone  (ROCEPHIN ) 2 g in sodium chloride  0.9 % 100 mL IVPB        2 g 200 mL/hr over 30 Minutes Intravenous Every 24 hours 01/10/24 0551 01/16/24 0229   01/11/24 0230  azithromycin  (ZITHROMAX ) 500 mg in sodium chloride  0.9 % 250 mL IVPB        500 mg 250  mL/hr over 60 Minutes Intravenous Every 24 hours 01/10/24 0551 01/16/24 0229   01/10/24 0230  cefTRIAXone  (ROCEPHIN ) 1 g in sodium chloride  0.9 % 100 mL IVPB        1 g 200 mL/hr over 30 Minutes Intravenous  Once 01/10/24 0218 01/10/24 0329   01/10/24 0230  azithromycin  (ZITHROMAX ) 500 mg in sodium chloride   0.9 % 250 mL IVPB        500 mg 250 mL/hr over 60 Minutes Intravenous  Once 01/10/24 0218 01/10/24 0441      No results found for: SDES, SPECREQUEST, CULT, REPTSTATUS   Radiology Studies: DG Chest 2 View Result Date: 01/10/2024 CLINICAL DATA:  Cough and congestion. EXAM: CHEST - 2 VIEW COMPARISON:  December 04, 2022 FINDINGS: The heart size and mediastinal contours are within normal limits. Mild atelectasis and/or infiltrate is seen within the right lung base. Very mild atelectatic changes are also noted within the of periphery of the left lung base. No pleural effusion or pneumothorax is identified. Radiopaque surgical clips are seen within the right upper quadrant. The visualized skeletal structures are unremarkable. IMPRESSION: Mild right basilar atelectasis and/or infiltrate. Electronically Signed   By: Suzen Dials M.D.   On: 01/10/2024 00:15     LOS: 0 days   Total time spent in review of labs and imaging, patient evaluation, formulation of plan, documentation and communication with family: 35 minutes  Mennie LAMY, MD  [facility]  01/10/2024, 8:08 AM

## 2024-01-10 NOTE — Hospital Course (Addendum)
 36  yof w/ history of bipolar disorder, hypothyroidism, hypertension, ADHD, GERD, migraines who developed some right-sided chest pain, facial flushing on last Wednesday which became progressively worse including chest pain over the coming days worse with breathing.  She also developed fever cough and she woke up becoming dizzy and she had to crawl to the bathroom because of her dizziness.  Endorsed not eating much past 2 days In the ED:BP 111/77, eventually dipping to 90/54.  1 L NS bolus given.  Tmax 100.3.  Patient hypoxic to 86%, on 2 L O2 spo2 95%.WBCs 14.5. CXR>>mild right hilar atelectasis and/or infiltrate.  Strep throat positive.  Respiratory panel negative.  Blood cultures x 2 collected and admitted. Patient being treated for strep throat pharyngitis and sepsis with possible community-acquired pneumonia, managed with IV antibiotics blood culture no growth.  Leukocytosis overall improving.  Patient has been weaned off to room air, she has remained afebrile since 2/10 and leukocytosis resolved blood culture no growth so far.  Had low-grade temperature at 100  RVP panel ordered 2/13.  Patient doing well and on room air and at this time wants to go home.

## 2024-01-11 DIAGNOSIS — J189 Pneumonia, unspecified organism: Secondary | ICD-10-CM | POA: Diagnosis not present

## 2024-01-11 LAB — CBC
HCT: 35.9 % — ABNORMAL LOW (ref 36.0–46.0)
Hemoglobin: 11.1 g/dL — ABNORMAL LOW (ref 12.0–15.0)
MCH: 32.5 pg (ref 26.0–34.0)
MCHC: 30.9 g/dL (ref 30.0–36.0)
MCV: 105 fL — ABNORMAL HIGH (ref 80.0–100.0)
Platelets: 172 10*3/uL (ref 150–400)
RBC: 3.42 MIL/uL — ABNORMAL LOW (ref 3.87–5.11)
RDW: 13.6 % (ref 11.5–15.5)
WBC: 11.7 10*3/uL — ABNORMAL HIGH (ref 4.0–10.5)
nRBC: 0 % (ref 0.0–0.2)

## 2024-01-11 LAB — BASIC METABOLIC PANEL
Anion gap: 7 (ref 5–15)
BUN: 9 mg/dL (ref 6–20)
CO2: 22 mmol/L (ref 22–32)
Calcium: 7.9 mg/dL — ABNORMAL LOW (ref 8.9–10.3)
Chloride: 107 mmol/L (ref 98–111)
Creatinine, Ser: 0.77 mg/dL (ref 0.44–1.00)
GFR, Estimated: >60 mL/min (ref >60)
Glucose, Bld: 118 mg/dL — ABNORMAL HIGH (ref 70–99)
Potassium: 4.2 mmol/L (ref 3.5–5.1)
Sodium: 136 mmol/L (ref 135–145)

## 2024-01-11 MED ORDER — ALPRAZOLAM 0.5 MG PO TABS
2.0000 mg | ORAL_TABLET | Freq: Three times a day (TID) | ORAL | Status: DC | PRN
  Administered 2024-01-11 – 2024-01-13 (×5): 2 mg via ORAL
  Filled 2024-01-11 (×5): qty 4

## 2024-01-11 MED ORDER — AZITHROMYCIN 250 MG PO TABS
500.0000 mg | ORAL_TABLET | Freq: Every day | ORAL | Status: AC
  Administered 2024-01-12 – 2024-01-14 (×3): 500 mg via ORAL
  Filled 2024-01-11 (×3): qty 2

## 2024-01-11 MED ORDER — OXYCODONE HCL 5 MG PO TABS: 5.0000 mg | ORAL_TABLET | ORAL | Status: DC | PRN

## 2024-01-11 MED ORDER — OXYCODONE-ACETAMINOPHEN 5-325 MG PO TABS
1.0000 | ORAL_TABLET | ORAL | Status: DC | PRN
  Administered 2024-01-11 – 2024-01-13 (×9): 1 via ORAL
  Filled 2024-01-11 (×9): qty 1

## 2024-01-11 MED ORDER — OXYCODONE-ACETAMINOPHEN 10-325 MG PO TABS: 1.0000 | ORAL_TABLET | ORAL | Status: DC | PRN

## 2024-01-11 NOTE — Progress Notes (Signed)
 PROGRESS NOTE Alison Blake  EXB:[id number] DOB: [birth year] DOA: 01/09/2024 PCP: Jimmey Mould, MD  Brief Narrative/Hospital Course: 30  yof w/ history of bipolar disorder, hypothyroidism, hypertension, ADHD, GERD, migraines who developed some right-sided chest pain, facial flushing on last Wednesday which became progressively worse including chest pain over the coming days worse with breathing.  She also developed fever cough and she woke up becoming dizzy and she had to crawl to the bathroom because of her dizziness.  Endorsed not eating much past 2 days In the ED:BP 111/77, eventually dipping to 90/54.  1 L NS bolus given.  Tmax 100.3.  Patient hypoxic to 86%, on 2 L O2 spo2 95%.WBCs 14.5 CXR>>mild right hilar atelectasis and/or infiltrate.  Strep throat positive.  Respiratory panel negative.  Blood cultures x 2 collected and admitted.    Subjective: Patient seen and examined this morning  Complains of headache some wheezing and cough  She states she takes Oxy 10 milligram up to 6 times at home and Xanax  and wants to resume  Mother and son at the bedside Overnight afebrile, on room air Labs stable Assessment and Plan: Principal Problem:   CAP (community acquired pneumonia) Active Problems:   Strep throat   HTN (hypertension)   Hypothyroidism   Bipolar disease, chronic (HCC)   ADHD   GERD (gastroesophageal reflux disease)   Community acquired pneumonia of both lungs  Strep throat pharyngitis CAP Sepsis POA: Patient with SIRS criteria with leukocytosis 2/2 Strep throat and community-acquired pneumonia. Overall clinically improving, leukocytosis down, continue Ceftriaxone  azithromycin , supplemental oxygen- but now weaned off BP stable repeat lactic acid normal  Recent Labs  Lab 01/10/24 0000 01/10/24 0910 01/11/24 0421  WBC 14.5* 10.8* 11.7*  LATICACIDVEN  --  1.1  --   PROCALCITON  --  0.33  --     Acute hypoxic respiratory failure: Weaned off to RA  Bipolar  disorder Migraine history: Mood stable, CONT bedtime Seroquel , and prn meds  Hypothyroidism history: Has not taken her Synthroid in months - TSH normal.  Hypertension: Not on medication.  Chronic pain On chronic narcotics with oxycodone  and Xanax : Resume home Percocet and Xanax .  She states she follows up with pain clinic  Obesity: Patient's Body mass index is 37.97 kg/m. : Will benefit with PCP follow-up, weight loss  healthy lifestyle and outpatient sleep evaluation.   DVT prophylaxis: enoxaparin  (LOVENOX ) injection 40 mg Start: 01/10/24 0600 Code Status:   Code Status: Full Code Family Communication: plan of care discussed with patient at bedside. Patient status is: Remains hospitalized because of severity of illness Level of care: Med-Surg   Dispo: The patient is from: home            Anticipated disposition: home in 24 hrs Objective: Vitals last 24 hrs: Vitals:   01/10/24 1929 01/10/24 2123 01/10/24 2149 01/11/24 0537  BP: 130/72  131/79 127/79  Pulse: 99  (!) 108 (!) 108  Resp: 20   20  Temp: 98.3 F (36.8 C)   98.3 F (36.8 C)  TempSrc:    Oral  SpO2: 100%  94% 97%  Weight:  106.7 kg    Height:  5\' 6"  (1.676 m)     Weight change: 6.9 kg  Physical Examination: General exam: alert awake, oriented at baseline, older than stated age HEENT:Oral mucosa moist, Ear/Nose WNL grossly Respiratory system: Bilaterally diminished BS,no use of accessory muscle Cardiovascular system: S1 & S2 +, No JVD. Gastrointestinal system: Abdomen soft,NT,ND, BS+ Nervous System: Alert,  awake, moving all extremities,and following commands. Extremities: LE edema neg,distal peripheral pulses palpable and warm.  Skin: No rashes,no icterus. MSK: Normal muscle bulk,tone, power   Medications reviewed:  Scheduled Meds:  enoxaparin  (LOVENOX ) injection  40 mg Subcutaneous Q24H   famotidine   20 mg Oral BID   pantoprazole   40 mg Oral QHS   QUEtiapine   400 mg Oral QHS   Continuous  Infusions:  azithromycin  Stopped (01/11/24 0430)   cefTRIAXone  (ROCEPHIN )  IV Stopped (01/11/24 0240)    Diet Order             Diet Heart Room service appropriate? Yes; Fluid consistency: Thin  Diet effective now                   Intake/Output Summary (Last 24 hours) at 01/11/2024 0940 Last data filed at 01/11/2024 0730 Gross per 24 hour  Intake 2160.04 ml  Output --  Net 2160.04 ml   Net IO Since Admission: 2,160.04 mL [01/11/24 0940]  Wt Readings from Last 3 Encounters:  01/10/24 106.7 kg  12/04/22 99.8 kg  02/05/22 102.1 kg     Unresulted Labs (From admission, onward)     Start     Ordered   01/17/24 0500  Creatinine, serum  (enoxaparin  (LOVENOX )    CrCl >/= 30 ml/min)  Weekly,   R     Comments: while on enoxaparin  therapy    01/10/24 0548   01/11/24 0500  CBC  Daily,   R      01/10/24 0815   01/11/24 0500  Basic metabolic panel  Daily,   R      01/10/24 0815   01/10/24 0248  Legionella Pneumophila Serogp 1 Ur Ag  Once,   URGENT        01/10/24 0250   01/10/24 0248  Strep pneumoniae urinary antigen  Once,   URGENT        01/10/24 0250   01/10/24 0248  Expectorated Sputum Assessment w Gram Stain, Rflx to Resp Cult  Once,   R        01/10/24 0250   01/10/24 0239  Culture, blood (Routine X 2) w Reflex to ID Panel  BLOOD CULTURE X 2,   R (with STAT occurrences)      01/10/24 0238          Data Reviewed: I have personally reviewed following labs and imaging studies CBC: Recent Labs  Lab 01/10/24 0000 01/10/24 0910 01/11/24 0421  WBC 14.5* 10.8* 11.7*  HGB 13.0 12.4 11.1*  HCT 39.2 39.6 35.9*  MCV 100.3* 103.9* 105.0*  PLT 223 170 172   Basic Metabolic Panel:  Recent Labs  Lab 01/10/24 0000 01/11/24 0421  NA 135 136  K 3.7 4.2  CL 101 107  CO2 22 22  GLUCOSE 152* 118*  BUN 11 9  CREATININE 0.90 0.77  CALCIUM 8.8* 7.9*   GFR: Estimated Creatinine Clearance: 101.7 mL/min (by C-G formula based on SCr of 0.77 mg/dL). Sepsis Labs: Recent Labs   Lab 01/10/24 0910  PROCALCITON 0.33  LATICACIDVEN 1.1   Recent Results (from the past 240 hours)  Resp panel by RT-PCR (RSV, Flu A&B, Covid) Throat     Status: None   Collection Time: 01/10/24 12:14 AM   Specimen: Throat; Nasal Swab  Result Value Ref Range Status   SARS Coronavirus 2 by RT PCR NEGATIVE NEGATIVE Final    Comment: (NOTE) SARS-CoV-2 target nucleic acids are NOT DETECTED.  The SARS-CoV-2 RNA is generally  detectable in upper respiratory specimens during the acute phase of infection. The lowest concentration of SARS-CoV-2 viral copies this assay can detect is 138 copies/mL. A negative result does not preclude SARS-Cov-2 infection and should not be used as the sole basis for treatment or other patient management decisions. A negative result may occur with  improper specimen collection/handling, submission of specimen other than nasopharyngeal swab, presence of viral mutation(s) within the areas targeted by this assay, and inadequate number of viral copies(<138 copies/mL). A negative result must be combined with clinical observations, patient history, and epidemiological information. The expected result is Negative.  Fact Sheet for Patients:  BloggerCourse.com  Fact Sheet for Healthcare Providers:  SeriousBroker.it  This test is no t yet approved or cleared by the [place]  FDA and  has been authorized for detection and/or diagnosis of SARS-CoV-2 by FDA under an Emergency Use Authorization (EUA). This EUA will remain  in effect (meaning this test can be used) for the duration of the COVID-19 declaration under Section 564(b)(1) of the Act, 21 U.S.C.section 360bbb-3(b)(1), unless the authorization is terminated  or revoked sooner.       Influenza A by PCR NEGATIVE NEGATIVE Final   Influenza B by PCR NEGATIVE NEGATIVE Final    Comment: (NOTE) The Xpert Xpress SARS-CoV-2/FLU/RSV plus assay is intended as an  aid in the diagnosis of influenza from Nasopharyngeal swab specimens and should not be used as a sole basis for treatment. Nasal washings and aspirates are unacceptable for Xpert Xpress SARS-CoV-2/FLU/RSV testing.  Fact Sheet for Patients: BloggerCourse.com  Fact Sheet for Healthcare Providers: SeriousBroker.it  This test is not yet approved or cleared by the [place]  FDA and has been authorized for detection and/or diagnosis of SARS-CoV-2 by FDA under an Emergency Use Authorization (EUA). This EUA will remain in effect (meaning this test can be used) for the duration of the COVID-19 declaration under Section 564(b)(1) of the Act, 21 U.S.C. section 360bbb-3(b)(1), unless the authorization is terminated or revoked.     Resp Syncytial Virus by PCR NEGATIVE NEGATIVE Final    Comment: (NOTE) Fact Sheet for Patients: BloggerCourse.com  Fact Sheet for Healthcare Providers: SeriousBroker.it  This test is not yet approved or cleared by the [place]  FDA and has been authorized for detection and/or diagnosis of SARS-CoV-2 by FDA under an Emergency Use Authorization (EUA). This EUA will remain in effect (meaning this test can be used) for the duration of the COVID-19 declaration under Section 564(b)(1) of the Act, 21 U.S.C. section 360bbb-3(b)(1), unless the authorization is terminated or revoked.  Performed at [facility], [address] [place], [place] [address]   Group A Strep by PCR     Status: Abnormal   Collection Time: 01/10/24 12:15 AM   Specimen: Throat; Sterile Swab  Result Value Ref Range Status   Group A Strep by PCR DETECTED (A) NOT DETECTED Final    Comment: Performed at [facility], [address], [place], [place] [address]  Culture, blood (Routine X 2) w Reflex to ID Panel     Status: None (Preliminary result)    Collection Time: 01/10/24  2:42 AM   Specimen: BLOOD  Result Value Ref Range Status   Specimen Description   Final    BLOOD BLOOD RIGHT ARM Performed at [facility], [address] [place], [place] [address]    Special Requests   Final    Blood Culture adequate volume BOTTLES DRAWN AEROBIC AND ANAEROBIC Performed at  [facility], [address] [place], [place] [address]    Culture   Final    NO GROWTH < 12 HOURS Performed at [facility], [address] [place], [place] [address]    Report Status PENDING  Incomplete    Antimicrobials/Microbiology: Anti-infectives (From admission, onward)    Start     Dose/Rate Route Frequency Ordered Stop   01/11/24 0230  cefTRIAXone  (ROCEPHIN ) 2 g in sodium chloride  0.9 % 100 mL IVPB        2 g 200 mL/hr over 30 Minutes Intravenous Every 24 hours 01/10/24 0551 01/16/24 0229   01/11/24 0230  azithromycin  (ZITHROMAX ) 500 mg in sodium chloride  0.9 % 250 mL IVPB        500 mg 250 mL/hr over 60 Minutes Intravenous Every 24 hours 01/10/24 0551 01/16/24 0229   01/10/24 0230  cefTRIAXone  (ROCEPHIN ) 1 g in sodium chloride  0.9 % 100 mL IVPB        1 g 200 mL/hr over 30 Minutes Intravenous  Once 01/10/24 0218 01/10/24 0329   01/10/24 0230  azithromycin  (ZITHROMAX ) 500 mg in sodium chloride  0.9 % 250 mL IVPB        500 mg 250 mL/hr over 60 Minutes Intravenous  Once 01/10/24 0218 01/10/24 0441         Component Value Date/Time   SDES  01/10/2024 0242    BLOOD BLOOD RIGHT ARM Performed at [facility], [address], [place], [place] [address]    SPECREQUEST  01/10/2024 0242    Blood Culture adequate volume BOTTLES DRAWN AEROBIC AND ANAEROBIC Performed at [facility], [address], [place], [place] [address]    CULT  01/10/2024 0242    NO GROWTH < 12 HOURS Performed at [facility], [address], [place], [place] [address]    REPTSTATUS PENDING  01/10/2024 5784     Radiology Studies: DG Chest 2 View Result Date: 01/10/2024 CLINICAL DATA:  Cough and congestion. EXAM: CHEST - 2 VIEW COMPARISON:  December 04, 2022 FINDINGS: The heart size and mediastinal contours are within normal limits. Mild atelectasis and/or infiltrate is seen within the right lung base. Very mild atelectatic changes are also noted within the of periphery of the left lung base. No pleural effusion or pneumothorax is identified. Radiopaque surgical clips are seen within the right upper quadrant. The visualized skeletal structures are unremarkable. IMPRESSION: Mild right basilar atelectasis and/or infiltrate. Electronically Signed   By: Virgle Grime M.D.   On: 01/10/2024 00:15    LOS: 1 day   Total time spent in review of labs and imaging, patient evaluation, formulation of plan, documentation and communication with family: 35 minutes  Lesa Rape, MD  [facility]  01/11/2024, 9:40 AM

## 2024-01-11 NOTE — Plan of Care (Signed)

## 2024-01-11 NOTE — Progress Notes (Signed)
   01/11/24 1554  TOC Brief Assessment  Insurance and Status Reviewed  Patient has primary care physician Yes Avanell Bob, Laretta Pleasure, MD)  Home environment has been reviewed yes  Prior level of function: Independent  Prior/Current Home Services No current home services  Social Drivers of Health Review SDOH reviewed no interventions necessary  Readmission risk has been reviewed Yes  Transition of care needs no transition of care needs at this time

## 2024-01-12 DIAGNOSIS — J189 Pneumonia, unspecified organism: Secondary | ICD-10-CM | POA: Diagnosis not present

## 2024-01-12 LAB — BASIC METABOLIC PANEL
Anion gap: 7 (ref 5–15)
BUN: <5 mg/dL — ABNORMAL LOW (ref 6–20)
CO2: 25 mmol/L (ref 22–32)
Calcium: 8 mg/dL — ABNORMAL LOW (ref 8.9–10.3)
Chloride: 105 mmol/L (ref 98–111)
Creatinine, Ser: 0.79 mg/dL (ref 0.44–1.00)
GFR, Estimated: >60 mL/min (ref >60)
Glucose, Bld: 106 mg/dL — ABNORMAL HIGH (ref 70–99)
Potassium: 3.3 mmol/L — ABNORMAL LOW (ref 3.5–5.1)
Sodium: 137 mmol/L (ref 135–145)

## 2024-01-12 LAB — CBC
HCT: 33.1 % — ABNORMAL LOW (ref 36.0–46.0)
Hemoglobin: 10.7 g/dL — ABNORMAL LOW (ref 12.0–15.0)
MCH: 32.7 pg (ref 26.0–34.0)
MCHC: 32.3 g/dL (ref 30.0–36.0)
MCV: 101.2 fL — ABNORMAL HIGH (ref 80.0–100.0)
Platelets: 196 10*3/uL (ref 150–400)
RBC: 3.27 MIL/uL — ABNORMAL LOW (ref 3.87–5.11)
RDW: 13.5 % (ref 11.5–15.5)
WBC: 10.1 10*3/uL (ref 4.0–10.5)
nRBC: 0 % (ref 0.0–0.2)

## 2024-01-12 MED ORDER — SODIUM CHLORIDE 0.9 % IV SOLN: INTRAVENOUS | Status: AC

## 2024-01-12 MED ORDER — POTASSIUM CHLORIDE CRYS ER 20 MEQ PO TBCR
40.0000 meq | EXTENDED_RELEASE_TABLET | Freq: Once | ORAL | Status: AC
  Administered 2024-01-12: 40 meq via ORAL
  Filled 2024-01-12: qty 2

## 2024-01-12 NOTE — Progress Notes (Signed)
PROGRESS NOTE Alison Blake  ZOX:[id number] DOB: 02/04/1971 DOA: 01/09/2024 PCP: Daisy Floro, MD  Brief Narrative/Hospital Course: 60  yof w/ history of bipolar disorder, hypothyroidism, hypertension, ADHD, GERD, migraines who developed some right-sided chest pain, facial flushing on last Wednesday which became progressively worse including chest pain over the coming days worse with breathing.  She also developed fever cough and she woke up becoming dizzy and she had to crawl to the bathroom because of her dizziness.  Endorsed not eating much past 2 days In the ED:BP 111/77, eventually dipping to 90/54.  1 L NS bolus given.  Tmax 100.3.  Patient hypoxic to 86%, on 2 L O2 spo2 95%.WBCs 14.5. CXR>>mild right hilar atelectasis and/or infiltrate.  Strep throat positive.  Respiratory panel negative.  Blood cultures x 2 collected and admitted. Patient being treated for strep throat pharyngitis and sepsis with possible community-acquired pneumonia, managed with IV antibiotics blood culture no growth.  Leukocytosis overall improving.    Subjective: This morning.  Mother at the bedside patient feels somewhat better Patient had low-grade temperature 100.6 this morning, needing 2 L nasal cannula Labs shows mild hypokalemia leukocytosis resolved. Still having ongoing cough intermittent fever  Assessment and Plan: Principal Problem:   CAP (community acquired pneumonia) Active Problems:   Strep throat   HTN (hypertension)   Hypothyroidism   Bipolar disease, chronic (HCC)   ADHD   GERD (gastroesophageal reflux disease)   Community acquired pneumonia of both lungs  Strep throat pharyngitis CAP Sepsis POA: Patient with SIRS criteria with leukocytosis 2/2 Strep throat and chest x-ray with possible atelectasis/infiltrates concerning for CAP continue on IV antibiotics Rocephin/azithromycin.  Low-grade temp intermittently present, but WBC count normalized, blood culture NGTD.  Continue supplemental  oxygen and wean as tolerated. Recent Labs  Lab 01/10/24 0000 01/10/24 0910 01/11/24 0421 01/12/24 0436  WBC 14.5* 10.8* 11.7* 10.1  LATICACIDVEN  --  1.1  --   --   PROCALCITON  --  0.33  --   --     Acute hypoxic respiratory failure: 1 2 L nasal cannula overnight again, weaned to room air as tolerated ambulate   Bipolar disorder Migraine history: Mood stable, Cont home seroquel, and prn meds  Hypothyroidism history: Has not taken her Synthroid in months - TSH normal. Fu w/ PCP  Hypertension: Not on medication.  Chronic pain On chronic narcotics with oxycodone and Xanax: She is on chronic Percocet high-dose along with Xanax continue home dose as tolerated  Obesity: Patient's Body mass index is 37.97 kg/m. : Will benefit with PCP follow-up, weight loss  healthy lifestyle and outpatient sleep evaluation.   DVT prophylaxis: enoxaparin (LOVENOX) injection 40 mg Start: 01/10/24 0600 Code Status:   Code Status: Full Code Family Communication: plan of care discussed with patient at bedside. Patient status is: Remains hospitalized because of severity of illness Level of care: Med-Surg   Dispo: The patient is from: home            Anticipated disposition: home in 24 hrs Objective: Vitals last 24 hrs: Vitals:   01/12/24 0643 01/12/24 0934 01/12/24 1004 01/12/24 1100  BP: 112/71 110/67    Pulse: 85 88    Resp: 20     Temp: 98.9 F (37.2 C) 98.4 F (36.9 C)    TempSrc: Oral Oral    SpO2: 96% 96% 91% 94%  Weight:      Height:       Weight change:   Physical Examination: General exam: alert  awake, HEENT:Oral mucosa moist, Ear/Nose WNL grossly Respiratory system: Bilaterally diminished BS,no use of accessory muscle Cardiovascular system: S1 & S2 +, No JVD. Gastrointestinal system: Abdomen soft,NT,ND, BS+ Nervous System: Alert, awake, moving all extremities,and following commands. Extremities: LE edema neg,distal peripheral pulses palpable and warm.  Skin: No  rashes,no icterus. MSK: Normal muscle bulk,tone, power   Medications reviewed:  Scheduled Meds:  azithromycin  500 mg Oral Daily   enoxaparin (LOVENOX) injection  40 mg Subcutaneous Q24H   famotidine  20 mg Oral BID   pantoprazole  40 mg Oral QHS   QUEtiapine  400 mg Oral QHS   Continuous Infusions:  cefTRIAXone (ROCEPHIN)  IV Stopped (01/12/24 0235)    Diet Order             Diet Heart Room service appropriate? Yes; Fluid consistency: Thin  Diet effective now                   Intake/Output Summary (Last 24 hours) at 01/12/2024 1151 Last data filed at 01/12/2024 0459 Gross per 24 hour  Intake 469.22 ml  Output --  Net 469.22 ml   Net IO Since Admission: 2,629.26 mL [01/12/24 1151]  Wt Readings from Last 3 Encounters:  01/10/24 106.7 kg  12/04/22 99.8 kg  02/05/22 102.1 kg     Unresulted Labs (From admission, onward)     Start     Ordered   01/17/24 0500  Creatinine, serum  (enoxaparin (LOVENOX)    CrCl >/= 30 ml/min)  Weekly,   R     Comments: while on enoxaparin therapy    01/10/24 0548   01/11/24 0500  CBC  Daily,   R      01/10/24 0815   01/11/24 0500  Basic metabolic panel  Daily,   R      01/10/24 0815   01/10/24 0248  Legionella Pneumophila Serogp 1 Ur Ag  Once,   URGENT        01/10/24 0250   01/10/24 0248  Strep pneumoniae urinary antigen  Once,   URGENT        01/10/24 0250   01/10/24 0248  Expectorated Sputum Assessment w Gram Stain, Rflx to Resp Cult  Once,   R        01/10/24 0250          Data Reviewed: I have personally reviewed following labs and imaging studies CBC: Recent Labs  Lab 01/10/24 0000 01/10/24 0910 01/11/24 0421 01/12/24 0436  WBC 14.5* 10.8* 11.7* 10.1  HGB 13.0 12.4 11.1* 10.7*  HCT 39.2 39.6 35.9* 33.1*  MCV 100.3* 103.9* 105.0* 101.2*  PLT 223 170 172 196   Basic Metabolic Panel:  Recent Labs  Lab 01/10/24 0000 01/11/24 0421 01/12/24 0436  NA 135 136 137  K 3.7 4.2 3.3*  CL 101 107 105  CO2 22 22 25    GLUCOSE 152* 118* 106*  BUN 11 9 <5*  CREATININE 0.90 0.77 0.79  CALCIUM 8.8* 7.9* 8.0*   GFR: Estimated Creatinine Clearance: 101.7 mL/min (by C-G formula based on SCr of 0.79 mg/dL). Sepsis Labs: Recent Labs  Lab 01/10/24 0910  PROCALCITON 0.33  LATICACIDVEN 1.1   Recent Results (from the past 240 hours)  Resp panel by RT-PCR (RSV, Flu A&B, Covid) Throat     Status: None   Collection Time: 01/10/24 12:14 AM   Specimen: Throat; Nasal Swab  Result Value Ref Range Status   SARS Coronavirus 2 by RT PCR NEGATIVE NEGATIVE Final  Comment: (NOTE) SARS-CoV-2 target nucleic acids are NOT DETECTED.  The SARS-CoV-2 RNA is generally detectable in upper respiratory specimens during the acute phase of infection. The lowest concentration of SARS-CoV-2 viral copies this assay can detect is 138 copies/mL. A negative result does not preclude SARS-Cov-2 infection and should not be used as the sole basis for treatment or other patient management decisions. A negative result may occur with  improper specimen collection/handling, submission of specimen other than nasopharyngeal swab, presence of viral mutation(s) within the areas targeted by this assay, and inadequate number of viral copies(<138 copies/mL). A negative result must be combined with clinical observations, patient history, and epidemiological information. The expected result is Negative.  Fact Sheet for Patients:  BloggerCourse.com  Fact Sheet for Healthcare Providers:  SeriousBroker.it  This test is no t yet approved or cleared by the [place] FDA and  has been authorized for detection and/or diagnosis of SARS-CoV-2 by FDA under an Emergency Use Authorization (EUA). This EUA will remain  in effect (meaning this test can be used) for the duration of the COVID-19 declaration under Section 564(b)(1) of the Act, 21 U.S.C.section 360bbb-3(b)(1), unless the authorization is  terminated  or revoked sooner.       Influenza A by PCR NEGATIVE NEGATIVE Final   Influenza B by PCR NEGATIVE NEGATIVE Final    Comment: (NOTE) The Xpert Xpress SARS-CoV-2/FLU/RSV plus assay is intended as an aid in the diagnosis of influenza from Nasopharyngeal swab specimens and should not be used as a sole basis for treatment. Nasal washings and aspirates are unacceptable for Xpert Xpress SARS-CoV-2/FLU/RSV testing.  Fact Sheet for Patients: BloggerCourse.com  Fact Sheet for Healthcare Providers: SeriousBroker.it  This test is not yet approved or cleared by the [place] FDA and has been authorized for detection and/or diagnosis of SARS-CoV-2 by FDA under an Emergency Use Authorization (EUA). This EUA will remain in effect (meaning this test can be used) for the duration of the COVID-19 declaration under Section 564(b)(1) of the Act, 21 U.S.C. section 360bbb-3(b)(1), unless the authorization is terminated or revoked.     Resp Syncytial Virus by PCR NEGATIVE NEGATIVE Final    Comment: (NOTE) Fact Sheet for Patients: BloggerCourse.com  Fact Sheet for Healthcare Providers: SeriousBroker.it  This test is not yet approved or cleared by the [place] FDA and has been authorized for detection and/or diagnosis of SARS-CoV-2 by FDA under an Emergency Use Authorization (EUA). This EUA will remain in effect (meaning this test can be used) for the duration of the COVID-19 declaration under Section 564(b)(1) of the Act, 21 U.S.C. section 360bbb-3(b)(1), unless the authorization is terminated or revoked.  Performed at [facility], [address] [place], [place] [address]   Group A Strep by PCR     Status: Abnormal   Collection Time: 01/10/24 12:15 AM   Specimen: Throat; Sterile Swab  Result Value Ref Range Status   Group A Strep by PCR DETECTED (A)  NOT DETECTED Final    Comment: Performed at [facility], [address] [place], [place] [address]  Culture, blood (Routine X 2) w Reflex to ID Panel     Status: None (Preliminary result)   Collection Time: 01/10/24  2:42 AM   Specimen: BLOOD  Result Value Ref Range Status   Specimen Description   Final    BLOOD BLOOD RIGHT ARM Performed at [facility], [address] [place], [place] [address]    Special Requests  Final    Blood Culture adequate volume BOTTLES DRAWN AEROBIC AND ANAEROBIC Performed at [facility], [address], [place], [place] [address]    Culture   Final    NO GROWTH 2 DAYS Performed at [facility], [address], [place], [place] [address]    Report Status PENDING  Incomplete  Culture, blood (Routine X 2) w Reflex to ID Panel     Status: None (Preliminary result)   Collection Time: 01/10/24  5:10 PM   Specimen: BLOOD  Result Value Ref Range Status   Specimen Description   Final    BLOOD BLOOD LEFT ARM AEROBIC BOTTLE ONLY ANAEROBIC BOTTLE ONLY Performed at [facility], [address], [place], [place] [address]    Special Requests   Final    BOTTLES DRAWN AEROBIC AND ANAEROBIC Blood Culture results may not be optimal due to an inadequate volume of blood received in culture bottles Performed at [facility], [address], [place], [place] [address]    Culture   Final    NO GROWTH 2 DAYS Performed at [facility], [address] [place], [place] [address]    Report Status PENDING  Incomplete    Antimicrobials/Microbiology: Anti-infectives (From admission, onward)    Start     Dose/Rate Route Frequency Ordered Stop   01/12/24 1000  azithromycin (ZITHROMAX) tablet 500 mg        500 mg Oral Daily 01/11/24 1229 01/15/24 0959   01/11/24 0230  cefTRIAXone (ROCEPHIN) 2 g in sodium chloride 0.9 % 100 mL IVPB        2 g 200 mL/hr over 30 Minutes  Intravenous Every 24 hours 01/10/24 0551 01/16/24 0229   01/11/24 0230  azithromycin (ZITHROMAX) 500 mg in sodium chloride 0.9 % 250 mL IVPB  Status:  Discontinued        500 mg 250 mL/hr over 60 Minutes Intravenous Every 24 hours 01/10/24 0551 01/11/24 1229   01/10/24 0230  cefTRIAXone (ROCEPHIN) 1 g in sodium chloride 0.9 % 100 mL IVPB        1 g 200 mL/hr over 30 Minutes Intravenous  Once 01/10/24 0218 01/10/24 0329   01/10/24 0230  azithromycin (ZITHROMAX) 500 mg in sodium chloride 0.9 % 250 mL IVPB        500 mg 250 mL/hr over 60 Minutes Intravenous  Once 01/10/24 0218 01/10/24 0441         Component Value Date/Time   SDES  01/10/2024 1710    BLOOD BLOOD LEFT ARM AEROBIC BOTTLE ONLY ANAEROBIC BOTTLE ONLY Performed at [facility], [address] [place], [place], [place] [address]    SPECREQUEST  01/10/2024 1710    BOTTLES DRAWN AEROBIC AND ANAEROBIC Blood Culture results may not be optimal due to an inadequate volume of blood received in culture bottles Performed at [facility], [address] [place], [place] [address]    CULT  01/10/2024 1710    NO GROWTH 2 DAYS Performed at [facility], [address], [place], [place] [address]    REPTSTATUS PENDING 01/10/2024 1710     Radiology Studies: No results found.   LOS: 2 days   Total time spent in review of labs and imaging, patient evaluation, formulation of plan, documentation and communication with family: 35 minutes  Lanae Boast, MD  [facility]  01/12/2024, 11:51 AM

## 2024-01-12 NOTE — Plan of Care (Signed)

## 2024-01-12 NOTE — Progress Notes (Signed)
   01/12/24 0400  Assess: MEWS Score  Temp 99.7 F (37.6 C)  BP 125/76  MAP (mmHg) 90  Pulse Rate (!) 101  Resp (!) 22  SpO2 (!) 88 %  O2 Device Room Air  Assess: MEWS Score  MEWS Temp 0  MEWS Systolic 0  MEWS Pulse 1  MEWS RR 1  MEWS LOC 0  MEWS Score 2  MEWS Score Color Yellow  Assess: if the MEWS score is Yellow or Red  Were vital signs accurate and taken at a resting state? Yes  Does the patient meet 2 or more of the SIRS criteria? Yes  Does the patient have a confirmed or suspected source of infection? Yes  MEWS guidelines implemented  Yes, yellow  Treat  MEWS Interventions Considered administering scheduled or prn medications/treatments as ordered  Take Vital Signs  Increase Vital Sign Frequency  Yellow: Q2hr x1, continue Q4hrs until patient remains green for 12hrs  Escalate  MEWS: Escalate Yellow: Discuss with charge nurse and consider notifying provider and/or RRT  Notify: Charge Nurse/RN  Name of Charge Nurse/RN Notified Jennings Books J.,RN  Provider Notification  Provider Name/Title J. Garner Nash NP  Date Provider Notified 01/12/24  Time Provider Notified [phone]  Method of Notification Page  Notification Reason Other (Comment) (Yellow MEWS)  Provider response No new orders  Date of Provider Response 01/12/24  Time of Provider Response 0500  Notify: Rapid Response  Name of Rapid Response RN Notified Franki Cabot., RN  Date Rapid Response Notified 01/12/24  Time Rapid Response Notified 0557  Assess: SIRS CRITERIA  SIRS Temperature  0  SIRS Respirations  1  SIRS Pulse 1  SIRS WBC 0  SIRS Score Sum  2   Patient alert and placed on 2L [place], no s/s of respiratory distress, PRN medication given and adjusted room temperature.

## 2024-01-13 DIAGNOSIS — J189 Pneumonia, unspecified organism: Secondary | ICD-10-CM | POA: Diagnosis not present

## 2024-01-13 LAB — BASIC METABOLIC PANEL
Anion gap: 9 (ref 5–15)
BUN: <5 mg/dL — ABNORMAL LOW (ref 6–20)
CO2: 25 mmol/L (ref 22–32)
Calcium: 7.9 mg/dL — ABNORMAL LOW (ref 8.9–10.3)
Chloride: 100 mmol/L (ref 98–111)
Creatinine, Ser: 0.76 mg/dL (ref 0.44–1.00)
GFR, Estimated: >60 mL/min (ref >60)
Glucose, Bld: 98 mg/dL (ref 70–99)
Potassium: 3.5 mmol/L (ref 3.5–5.1)
Sodium: 134 mmol/L — ABNORMAL LOW (ref 135–145)

## 2024-01-13 LAB — CBC
HCT: 35.2 % — ABNORMAL LOW (ref 36.0–46.0)
Hemoglobin: 11.5 g/dL — ABNORMAL LOW (ref 12.0–15.0)
MCH: 33.5 pg (ref 26.0–34.0)
MCHC: 32.7 g/dL (ref 30.0–36.0)
MCV: 102.6 fL — ABNORMAL HIGH (ref 80.0–100.0)
Platelets: 224 10*3/uL (ref 150–400)
RBC: 3.43 MIL/uL — ABNORMAL LOW (ref 3.87–5.11)
RDW: 13.4 % (ref 11.5–15.5)
WBC: 7.8 10*3/uL (ref 4.0–10.5)
nRBC: 0 % (ref 0.0–0.2)

## 2024-01-13 MED ORDER — DIPHENHYDRAMINE HCL 50 MG/ML IJ SOLN: 25.0000 mg | Freq: Once | INTRAMUSCULAR | Status: DC

## 2024-01-13 MED ORDER — SODIUM CHLORIDE 0.9 % IV SOLN
25.0000 mg | Freq: Once | INTRAVENOUS | Status: DC
  Filled 2024-01-13: qty 1

## 2024-01-13 MED ORDER — METOCLOPRAMIDE HCL 5 MG/ML IJ SOLN
10.0000 mg | Freq: Once | INTRAMUSCULAR | Status: AC
  Administered 2024-01-13: 10 mg via INTRAVENOUS
  Filled 2024-01-13: qty 2

## 2024-01-13 MED ORDER — MAGNESIUM SULFATE 2 GM/50ML IV SOLN: 2.0000 g | Freq: Once | INTRAVENOUS | Status: DC

## 2024-01-13 MED ORDER — KETOROLAC TROMETHAMINE 30 MG/ML IJ SOLN: 30.0000 mg | INTRAMUSCULAR | Status: DC

## 2024-01-13 MED ORDER — DEXAMETHASONE SODIUM PHOSPHATE 10 MG/ML IJ SOLN
4.0000 mg | Freq: Once | INTRAMUSCULAR | Status: AC
  Administered 2024-01-13: 4 mg via INTRAVENOUS
  Filled 2024-01-13: qty 1

## 2024-01-13 MED ORDER — KETOROLAC TROMETHAMINE 30 MG/ML IJ SOLN: 30.0000 mg | Freq: Once | INTRAMUSCULAR | Status: DC

## 2024-01-13 MED ORDER — DIPHENHYDRAMINE HCL 50 MG/ML IJ SOLN: 12.5000 mg | Freq: Once | INTRAMUSCULAR | Status: DC

## 2024-01-13 MED ORDER — SODIUM CHLORIDE 0.9 % IV SOLN: INTRAVENOUS | Status: DC

## 2024-01-13 NOTE — Progress Notes (Addendum)
PROGRESS NOTE Alison Blake  ZOX:[id number] DOB: [birth year] DOA: 01/09/2024 PCP: Daisy Floro, MD  Brief Narrative/Hospital Course: 20  yof w/ history of bipolar disorder, hypothyroidism, hypertension, ADHD, GERD, migraines who developed some right-sided chest pain, facial flushing on last Wednesday which became progressively worse including chest pain over the coming days worse with breathing.  She also developed fever cough and she woke up becoming dizzy and she had to crawl to the bathroom because of her dizziness.  Endorsed not eating much past 2 days In the ED:BP 111/77, eventually dipping to 90/54.  1 L NS bolus given.  Tmax 100.3.  Patient hypoxic to 86%, on 2 L O2 spo2 95%.WBCs 14.5. CXR>>mild right hilar atelectasis and/or infiltrate.  Strep throat positive.  Respiratory panel negative.  Blood cultures x 2 collected and admitted. Patient being treated for strep throat pharyngitis and sepsis with possible community-acquired pneumonia, managed with IV antibiotics blood culture no growth.  Leukocytosis overall improving.  Patient has been weaned off to room air, she has remained afebrile since 2/10 and leukocytosis resolved blood culture no growth so far.  At this time she is medically stable for discharge     Subjective: Patient seen and examined  Nauseous, having headache, no improvement despite taking her pain medication.   Reports this is her acute on chronic migraine Overnight afebrile BP stable doing well on room air. Assessment and Plan: Principal Problem:   CAP (community acquired pneumonia) Active Problems:   Strep throat   HTN (hypertension)   Hypothyroidism   Bipolar disease, chronic (HCC)   ADHD   GERD (gastroesophageal reflux disease)   Community acquired pneumonia of both lungs  Strep throat pharyngitis CAP Sepsis POA: Patient with SIRS criteria with leukocytosis 2/2 Strep throat and chest x-ray with possible atelectasis/infiltrates concerning for CAP  Blood  culture no growth leukocytosis resolved, has been afebrile since 2/10. She has clinically improved and will discharge her on oral antibiotics  Acute hypoxic respiratory failure: Resolved.  Weaned off to room air  Migraine headache: Patient complains of acute migraine episode, has been taking Imitrex.  She is on chronic pain medication as well.  Will order migraine cocktail -Benadryl with Reglan, Toradol, Decadron x 1. Cont ivf   Bipolar disorder Migraine history: Mood stable, Cont home seroquel, and prn meds   Hypothyroidism history: Has not taken her Synthroid in months - TSH normal. Fu w/ PCP   Hypertension: Not on medication.   Chronic pain On chronic narcotics with oxycodone and Xanax: She is on chronic Percocet  and Xanax, cont her home meds   Obesity: Patient's Body mass index is 37.97 kg/m. : Will benefit with PCP follow-up, weight loss  healthy lifestyle and outpatient sleep evaluation.  DVT prophylaxis: enoxaparin (LOVENOX) injection 40 mg Start: 01/10/24 0600 Code Status:   Code Status: Full Code Family Communication: plan of care discussed with patient at bedside. Patient status is: Remains hospitalized because of severity of illness Level of care: Med-Surg   Dispo: The patient is from: home            Anticipated disposition: home in 24 hrs Objective: Vitals last 24 hrs: Vitals:   01/12/24 1807 01/12/24 2143 01/13/24 0258 01/13/24 0424  BP: 134/87 136/85 123/86 113/78  Pulse: 88 88 95 82  Resp:  20 17 18   Temp: 98 F (36.7 C) 99 F (37.2 C) 97.7 F (36.5 C) 98.4 F (36.9 C)  TempSrc: Oral Oral Axillary Oral  SpO2: 95% 94% 92% 94%  Weight:      Height:       Weight change:   Physical Examination: General exam: alert awake, feels nauseous  and having headaches HEENT:Oral mucosa moist, Ear/Nose WNL grossly Respiratory system: Bilaterally clear BS,no use of accessory muscle Cardiovascular system: S1 & S2 +, No JVD. Gastrointestinal system: Abdomen  soft,NT,ND, BS+ Nervous System: Alert, awake, moving all extremities,and following commands. Extremities: LE edema neg,distal peripheral pulses palpable and warm.  Skin: No rashes,no icterus. MSK: Normal muscle bulk,tone, power   Medications reviewed:  Scheduled Meds:  azithromycin  500 mg Oral Daily   diphenhydrAMINE  12.5 mg Intravenous Once   enoxaparin (LOVENOX) injection  40 mg Subcutaneous Q24H   famotidine  20 mg Oral BID   ketorolac  30 mg Intravenous Once   pantoprazole  40 mg Oral QHS   QUEtiapine  400 mg Oral QHS   Continuous Infusions:  sodium chloride 40 mL/hr at 01/12/24 2208   cefTRIAXone (ROCEPHIN)  IV 2 g (01/13/24 0253)   magnesium sulfate bolus IVPB     promethazine (PHENERGAN) injection (IM or IVPB)      Diet Order             Diet regular Room service appropriate? Yes; Fluid consistency: Thin  Diet effective now                   Intake/Output Summary (Last 24 hours) at 01/13/2024 1300 Last data filed at 01/12/2024 1425 Gross per 24 hour  Intake 240 ml  Output --  Net 240 ml   Net IO Since Admission: 2,869.26 mL [01/13/24 1300]  Wt Readings from Last 3 Encounters:  01/10/24 106.7 kg  12/04/22 99.8 kg  02/05/22 102.1 kg     Unresulted Labs (From admission, onward)     Start     Ordered   01/17/24 0500  Creatinine, serum  (enoxaparin (LOVENOX)    CrCl >/= 30 ml/min)  Weekly,   R     Comments: while on enoxaparin therapy    01/10/24 0548   01/11/24 0500  CBC  Daily,   R      01/10/24 0815   01/11/24 0500  Basic metabolic panel  Daily,   R      01/10/24 0815   01/10/24 0248  Legionella Pneumophila Serogp 1 Ur Ag  Once,   URGENT        01/10/24 0250   01/10/24 0248  Strep pneumoniae urinary antigen  Once,   URGENT        01/10/24 0250   01/10/24 0248  Expectorated Sputum Assessment w Gram Stain, Rflx to Resp Cult  Once,   R        01/10/24 0250          Data Reviewed: I have personally reviewed following labs and imaging  studies CBC: Recent Labs  Lab 01/10/24 0000 01/10/24 0910 01/11/24 0421 01/12/24 0436 01/13/24 0432  WBC 14.5* 10.8* 11.7* 10.1 7.8  HGB 13.0 12.4 11.1* 10.7* 11.5*  HCT 39.2 39.6 35.9* 33.1* 35.2*  MCV 100.3* 103.9* 105.0* 101.2* 102.6*  PLT 223 170 172 196 224   Basic Metabolic Panel:  Recent Labs  Lab 01/10/24 0000 01/11/24 0421 01/12/24 0436 01/13/24 0432  NA 135 136 137 134*  K 3.7 4.2 3.3* 3.5  CL 101 107 105 100  CO2 22 22 25 25   GLUCOSE 152* 118* 106* 98  BUN 11 9 <5* <5*  CREATININE 0.90 0.77 0.79 0.76  CALCIUM 8.8*  7.9* 8.0* 7.9*   GFR: Estimated Creatinine Clearance: 101.7 mL/min (by C-G formula based on SCr of 0.76 mg/dL). Sepsis Labs: Recent Labs  Lab 01/10/24 0910  PROCALCITON 0.33  LATICACIDVEN 1.1   Recent Results (from the past 240 hours)  Resp panel by RT-PCR (RSV, Flu A&B, Covid) Throat     Status: None   Collection Time: 01/10/24 12:14 AM   Specimen: Throat; Nasal Swab  Result Value Ref Range Status   SARS Coronavirus 2 by RT PCR NEGATIVE NEGATIVE Final    Comment: (NOTE) SARS-CoV-2 target nucleic acids are NOT DETECTED.  The SARS-CoV-2 RNA is generally detectable in upper respiratory specimens during the acute phase of infection. The lowest concentration of SARS-CoV-2 viral copies this assay can detect is 138 copies/mL. A negative result does not preclude SARS-Cov-2 infection and should not be used as the sole basis for treatment or other patient management decisions. A negative result may occur with  improper specimen collection/handling, submission of specimen other than nasopharyngeal swab, presence of viral mutation(s) within the areas targeted by this assay, and inadequate number of viral copies(<138 copies/mL). A negative result must be combined with clinical observations, patient history, and epidemiological information. The expected result is Negative.  Fact Sheet for Patients:  BloggerCourse.com  Fact  Sheet for Healthcare Providers:  SeriousBroker.it  This test is no t yet approved or cleared by the [place] FDA and  has been authorized for detection and/or diagnosis of SARS-CoV-2 by FDA under an Emergency Use Authorization (EUA). This EUA will remain  in effect (meaning this test can be used) for the duration of the COVID-19 declaration under Section 564(b)(1) of the Act, 21 U.S.C.section 360bbb-3(b)(1), unless the authorization is terminated  or revoked sooner.       Influenza A by PCR NEGATIVE NEGATIVE Final   Influenza B by PCR NEGATIVE NEGATIVE Final    Comment: (NOTE) The Xpert Xpress SARS-CoV-2/FLU/RSV plus assay is intended as an aid in the diagnosis of influenza from Nasopharyngeal swab specimens and should not be used as a sole basis for treatment. Nasal washings and aspirates are unacceptable for Xpert Xpress SARS-CoV-2/FLU/RSV testing.  Fact Sheet for Patients: BloggerCourse.com  Fact Sheet for Healthcare Providers: SeriousBroker.it  This test is not yet approved or cleared by the [place] FDA and has been authorized for detection and/or diagnosis of SARS-CoV-2 by FDA under an Emergency Use Authorization (EUA). This EUA will remain in effect (meaning this test can be used) for the duration of the COVID-19 declaration under Section 564(b)(1) of the Act, 21 U.S.C. section 360bbb-3(b)(1), unless the authorization is terminated or revoked.     Resp Syncytial Virus by PCR NEGATIVE NEGATIVE Final    Comment: (NOTE) Fact Sheet for Patients: BloggerCourse.com  Fact Sheet for Healthcare Providers: SeriousBroker.it  This test is not yet approved or cleared by the [place] FDA and has been authorized for detection and/or diagnosis of SARS-CoV-2 by FDA under an Emergency Use Authorization (EUA). This EUA will remain in effect  (meaning this test can be used) for the duration of the COVID-19 declaration under Section 564(b)(1) of the Act, 21 U.S.C. section 360bbb-3(b)(1), unless the authorization is terminated or revoked.  Performed at [facility], [address], [place], [place] [address]   Group A Strep by PCR     Status: Abnormal   Collection Time: 01/10/24 12:15 AM   Specimen: Throat; Sterile Swab  Result Value Ref Range Status   Group A Strep by PCR DETECTED (  A) NOT DETECTED Final    Comment: Performed at [facility], [address], [place], [place] [address]  Culture, blood (Routine X 2) w Reflex to ID Panel     Status: None (Preliminary result)   Collection Time: 01/10/24  2:42 AM   Specimen: BLOOD  Result Value Ref Range Status   Specimen Description   Final    BLOOD BLOOD RIGHT ARM Performed at [facility], [address], [place], [place] [address]    Special Requests   Final    Blood Culture adequate volume BOTTLES DRAWN AEROBIC AND ANAEROBIC Performed at [facility], [address], [place], [place] [address]    Culture   Final    NO GROWTH 3 DAYS Performed at [facility], [address] [place], [place] [address]    Report Status PENDING  Incomplete  Culture, blood (Routine X 2) w Reflex to ID Panel     Status: None (Preliminary result)   Collection Time: 01/10/24  5:10 PM   Specimen: BLOOD  Result Value Ref Range Status   Specimen Description   Final    BLOOD BLOOD LEFT ARM AEROBIC BOTTLE ONLY ANAEROBIC BOTTLE ONLY Performed at [facility], [address] [place], [place] [address]    Special Requests   Final    BOTTLES DRAWN AEROBIC AND ANAEROBIC Blood Culture results may not be optimal due to an inadequate volume of blood received in culture bottles Performed at [facility], [address], [place], [place] [address]    Culture   Final    NO GROWTH 3  DAYS Performed at [facility], [address], [place], [place] [address]    Report Status PENDING  Incomplete    Antimicrobials/Microbiology: Anti-infectives (From admission, onward)    Start     Dose/Rate Route Frequency Ordered Stop   01/12/24 1000  azithromycin (ZITHROMAX) tablet 500 mg        500 mg Oral Daily 01/11/24 1229 01/15/24 0959   01/11/24 0230  cefTRIAXone (ROCEPHIN) 2 g in sodium chloride 0.9 % 100 mL IVPB        2 g 200 mL/hr over 30 Minutes Intravenous Every 24 hours 01/10/24 0551 01/16/24 0229   01/11/24 0230  azithromycin (ZITHROMAX) 500 mg in sodium chloride 0.9 % 250 mL IVPB  Status:  Discontinued        500 mg 250 mL/hr over 60 Minutes Intravenous Every 24 hours 01/10/24 0551 01/11/24 1229   01/10/24 0230  cefTRIAXone (ROCEPHIN) 1 g in sodium chloride 0.9 % 100 mL IVPB        1 g 200 mL/hr over 30 Minutes Intravenous  Once 01/10/24 0218 01/10/24 0329   01/10/24 0230  azithromycin (ZITHROMAX) 500 mg in sodium chloride 0.9 % 250 mL IVPB        500 mg 250 mL/hr over 60 Minutes Intravenous  Once 01/10/24 0218 01/10/24 0441         Component Value Date/Time   SDES  01/10/2024 1710    BLOOD BLOOD LEFT ARM AEROBIC BOTTLE ONLY ANAEROBIC BOTTLE ONLY Performed at [facility], [address] [place], [place] [address]    SPECREQUEST  01/10/2024 1710    BOTTLES DRAWN AEROBIC AND ANAEROBIC Blood Culture results may not be optimal due to an inadequate volume of blood received in culture bottles Performed at [facility], [address], [place], [place] [address]  CULT  01/10/2024 1710    NO GROWTH 3 DAYS Performed at [facility], [address] [place] [address], [place], [place] [address]    REPTSTATUS PENDING 01/10/2024 1710     Radiology Studies: No results found.   LOS: 3 days   Total time spent in review of labs and imaging, patient evaluation, formulation of plan, documentation and communication with family: 35  minutes  Lanae Boast, MD  [facility]  01/13/2024, 1:00 PM

## 2024-01-13 NOTE — Progress Notes (Addendum)
1235- This nurse discussed with pt  medication mgmtas pt states currently med is effective enough, pt states current imitrex seems to not be working as well as it used to, this information relayed to [employer].  Md ordered medications to aid in help with nausea and headache  pt states she has improved and doesn't currently want added medications   1439-  md ordered meds to aide in nausea  pt received meds and became severely nausteated asked to hold other med until relieved    1509-  pt states pain is easing off slightly, attempting to eat crackers and drink some cool water   will monitor nausea and headache   1717-  pt states she takes imitrex at home   she will take 100 mg, and if that isnt sufficient she will take a second    Pt educated on medication safety and importance of following medication instructions   1816-  pt with mother at bedside, pt states headache is resolving and nausea isnt as bad as earlier and has attempted to eat broth      pt moving around room with ease  no distress at this time   1920- pt calm resting in bed  VSS at this time pt states her headache is much better  and pt was able to eat some chicken broth   mother was at bedside    pt and mother stated she feels much better   poc relayed to Rn handoff report completed at bedside with Thompson Grayer

## 2024-01-13 NOTE — Plan of Care (Signed)

## 2024-01-14 ENCOUNTER — Other Ambulatory Visit (HOSPITAL_COMMUNITY): Payer: Self-pay

## 2024-01-14 DIAGNOSIS — J189 Pneumonia, unspecified organism: Secondary | ICD-10-CM | POA: Diagnosis not present

## 2024-01-14 LAB — RESPIRATORY PANEL BY PCR

## 2024-01-14 LAB — CBC
HCT: 41.8 % (ref 36.0–46.0)
Hemoglobin: 13.8 g/dL (ref 12.0–15.0)
MCH: 32.8 pg (ref 26.0–34.0)
MCHC: 33 g/dL (ref 30.0–36.0)
MCV: 99.3 fL (ref 80.0–100.0)
Platelets: 251 10*3/uL (ref 150–400)
RBC: 4.21 MIL/uL (ref 3.87–5.11)
RDW: 13.1 % (ref 11.5–15.5)
WBC: 10.3 10*3/uL (ref 4.0–10.5)
nRBC: 0 % (ref 0.0–0.2)

## 2024-01-14 LAB — BASIC METABOLIC PANEL
Anion gap: 12 (ref 5–15)
BUN: 6 mg/dL (ref 6–20)
CO2: 23 mmol/L (ref 22–32)
Calcium: 8.9 mg/dL (ref 8.9–10.3)
Chloride: 103 mmol/L (ref 98–111)
Creatinine, Ser: 0.82 mg/dL (ref 0.44–1.00)
GFR, Estimated: >60 mL/min (ref >60)
Glucose, Bld: 110 mg/dL — ABNORMAL HIGH (ref 70–99)
Potassium: 3.7 mmol/L (ref 3.5–5.1)
Sodium: 138 mmol/L (ref 135–145)

## 2024-01-14 MED ORDER — ROBITUSSIN COUGH+CHEST CONG DM 20-200 MG/20ML PO LIQD
10.0000 mL | ORAL | 0 refills | Status: AC | PRN
Start: 2024-01-14 — End: ?
  Filled 2024-01-14: qty 118, 2d supply, fill #0

## 2024-01-14 MED ORDER — CEPHALEXIN 500 MG PO CAPS
500.0000 mg | ORAL_CAPSULE | Freq: Two times a day (BID) | ORAL | Status: DC
  Administered 2024-01-14: 500 mg via ORAL
  Filled 2024-01-14: qty 1

## 2024-01-14 MED ORDER — CEPHALEXIN 500 MG PO CAPS
500.0000 mg | ORAL_CAPSULE | Freq: Three times a day (TID) | ORAL | 0 refills | Status: AC
  Filled 2024-01-14: qty 21, 7d supply, fill #0

## 2024-01-14 NOTE — Plan of Care (Signed)

## 2024-01-14 NOTE — Plan of Care (Signed)
Problem: Education: Goal: Knowledge of General Education information will improve Description: Including pain rating scale, medication(s)/side effects and non-pharmacologic comfort measures 01/14/2024 1355 by Kathleene Hazel, RN Outcome: Adequate for Discharge 01/14/2024 1348 by Kathleene Hazel, RN Outcome: Progressing   Problem: Health Behavior/Discharge Planning: Goal: Ability to manage health-related needs will improve 01/14/2024 1355 by Kathleene Hazel, RN Outcome: Adequate for Discharge 01/14/2024 1348 by Kathleene Hazel, RN Outcome: Progressing   Problem: Clinical Measurements: Goal: Ability to maintain clinical measurements within normal limits will improve 01/14/2024 1355 by Kathleene Hazel, RN Outcome: Adequate for Discharge 01/14/2024 1348 by Kathleene Hazel, RN Outcome: Progressing Goal: Will remain free from infection 01/14/2024 1355 by Kathleene Hazel, RN Outcome: Adequate for Discharge 01/14/2024 1348 by Kathleene Hazel, RN Outcome: Progressing Goal: Diagnostic test results will improve 01/14/2024 1355 by Kathleene Hazel, RN Outcome: Adequate for Discharge 01/14/2024 1348 by Kathleene Hazel, RN Outcome: Progressing Goal: Respiratory complications will improve 01/14/2024 1355 by Kathleene Hazel, RN Outcome: Adequate for Discharge 01/14/2024 1348 by Kathleene Hazel, RN Outcome: Progressing Goal: Cardiovascular complication will be avoided 01/14/2024 1355 by Kathleene Hazel, RN Outcome: Adequate for Discharge 01/14/2024 1348 by Kathleene Hazel, RN Outcome: Progressing   Problem: Activity: Goal: Risk for activity intolerance will decrease 01/14/2024 1355 by Kathleene Hazel, RN Outcome: Adequate for Discharge 01/14/2024 1348 by Kathleene Hazel, RN Outcome: Progressing   Problem: Nutrition: Goal: Adequate nutrition will be maintained 01/14/2024 1355 by Kathleene Hazel, RN Outcome: Adequate for Discharge 01/14/2024 1348 by Kathleene Hazel, RN Outcome:  Progressing   Problem: Coping: Goal: Level of anxiety will decrease 01/14/2024 1355 by Kathleene Hazel, RN Outcome: Adequate for Discharge 01/14/2024 1348 by Kathleene Hazel, RN Outcome: Progressing   Problem: Elimination: Goal: Will not experience complications related to bowel motility 01/14/2024 1355 by Kathleene Hazel, RN Outcome: Adequate for Discharge 01/14/2024 1348 by Kathleene Hazel, RN Outcome: Progressing Goal: Will not experience complications related to urinary retention 01/14/2024 1355 by Kathleene Hazel, RN Outcome: Adequate for Discharge 01/14/2024 1348 by Kathleene Hazel, RN Outcome: Progressing   Problem: Pain Managment: Goal: General experience of comfort will improve and/or be controlled 01/14/2024 1355 by Kathleene Hazel, RN Outcome: Adequate for Discharge 01/14/2024 1348 by Kathleene Hazel, RN Outcome: Progressing   Problem: Safety: Goal: Ability to remain free from injury will improve 01/14/2024 1355 by Kathleene Hazel, RN Outcome: Adequate for Discharge 01/14/2024 1348 by Kathleene Hazel, RN Outcome: Progressing   Problem: Skin Integrity: Goal: Risk for impaired skin integrity will decrease 01/14/2024 1355 by Kathleene Hazel, RN Outcome: Adequate for Discharge 01/14/2024 1348 by Kathleene Hazel, RN Outcome: Progressing   Problem: Activity: Goal: Ability to tolerate increased activity will improve 01/14/2024 1355 by Kathleene Hazel, RN Outcome: Adequate for Discharge 01/14/2024 1348 by Kathleene Hazel, RN Outcome: Progressing   Problem: Clinical Measurements: Goal: Ability to maintain a body temperature in the normal range will improve 01/14/2024 1355 by Kathleene Hazel, RN Outcome: Adequate for Discharge 01/14/2024 1348 by Kathleene Hazel, RN Outcome: Progressing   Problem: Respiratory: Goal: Ability to maintain adequate ventilation will improve 01/14/2024 1355 by Kathleene Hazel, RN Outcome: Adequate for Discharge 01/14/2024 1348 by  Kathleene Hazel, RN Outcome: Progressing Goal: Ability to maintain a clear airway will improve 01/14/2024 1355 by Kathleene Hazel, RN Outcome: Adequate for Discharge 01/14/2024 1348 by Kathleene Hazel, RN Outcome:  Progressing

## 2024-01-14 NOTE — Progress Notes (Signed)
Medication delivered from [facility] outpatient pharmacy

## 2024-01-14 NOTE — Discharge Summary (Addendum)
Physician Discharge Summary  Alison Blake ZOX:[id number] DOB: December 07, 1970 DOA: 01/09/2024  PCP: Daisy Floro, MD  Admit date: 01/09/2024 Discharge date: 01/14/2024 Recommendations for Outpatient Follow-up:  Follow up with PCP in 1 weeks-call for appointment Please obtain BMP/CBC in one week  Discharge Dispo: home Discharge Condition: Stable Code Status:   Code Status: Full Code Diet recommendation:  Diet Order             Diet regular Room service appropriate? Yes; Fluid consistency: Thin  Diet effective now                  Brief/Interim Summary: 53  yof w/ history of bipolar disorder, hypothyroidism, hypertension, ADHD, GERD, migraines who developed some right-sided chest pain, facial flushing on last Wednesday which became progressively worse including chest pain over the coming days worse with breathing.  She also developed fever cough and she woke up becoming dizzy and she had to crawl to the bathroom because of her dizziness.  Endorsed not eating much past 2 days In the ED:BP 111/77, eventually dipping to 90/54.  1 L NS bolus given.  Tmax 100.3.  Patient hypoxic to 86%, on 2 L O2 spo2 95%.WBCs 14.5. CXR>>mild right hilar atelectasis and/or infiltrate.  Strep throat positive.  Respiratory panel negative.  Blood cultures x 2 collected and admitted. Patient being treated for strep throat pharyngitis and sepsis with possible community-acquired pneumonia, managed with IV antibiotics blood culture no growth.  Leukocytosis overall improving.  Patient has been weaned off to room air, she has remained afebrile since 2/10 and leukocytosis resolved blood culture no growth so far.  Had low-grade temperature at 100  RVP panel ordered 2/13.  Patient doing well and on room air and at this time wants to go home.   Discharge Diagnoses:  Principal Problem:   CAP (community acquired pneumonia) Active Problems:   Strep throat   HTN (hypertension)   Hypothyroidism   Bipolar disease, chronic  (HCC)   ADHD   GERD (gastroesophageal reflux disease)   Community acquired pneumonia of both lungs  Strep throat pharyngitis CAP Sepsis POA: Patient with SIRS criteria with leukocytosis 2/2 Strep throat and chest x-ray with possible atelectasis/infiltrates concerning for CAP  Treated with IV antibiotics.  Blood culture no growth to date.  Sputum, has been low-grade temperature but overall stable.  Respiratory virus panel sent due to low grade temperature.Blood culture NGTD. She has clinically improved and changed to keflex which she tolerated and she feels well enough to go home today although I did offer an additional day if she had some reservation   Acute hypoxic respiratory failure: Resolved.  Weaned off to room air   Migraine headache: Patient complains of acute migraine episode, has been taking Imitrex.  Migraine cocktail at this time improved again encouraged her to follow-up with PCP and neurology she did not want to be on Topamax due to side effect of nephrolithiasis   Bipolar disorder Migraine history: Mood stable, Cont home seroquel, and prn meds   Hypothyroidism history: Has not taken her Synthroid in months - TSH normal. Fu w/ PCP   Hypertension: Not on medication.   Chronic pain On chronic narcotics with oxycodone and Xanax: She is on chronic Percocet  and Xanax, cont her home meds   Obesity: Patient's Body mass index is 37.97 kg/m. : Will benefit with PCP follow-up, weight loss  healthy lifestyle and outpatient sleep evaluation.   Consults: None Subjective: Aaox3, alert awake.  Discharge Exam: Vitals:  01/13/24 2008 01/14/24 0418  BP: 128/82 124/80  Pulse: (!) 101   Resp: 18 19  Temp: 98.3 F (36.8 C) 100.1 F (37.8 C)  SpO2: 96% 92%   General: Pt is alert, awake, not in acute distress Cardiovascular: RRR, S1/S2 +, no rubs, no gallops Respiratory: CTA bilaterally, no wheezing, no rhonchi Abdominal: Soft, NT, ND, bowel sounds + Extremities: no  edema, no cyanosis  Discharge Instructions  Discharge Instructions     Discharge instructions   Complete by: As directed    Follow-up with PCP, follow-up with neurology for migraine.  Please call call MD or return to ER for similar or worsening recurring problem that brought you to hospital or if any fever,nausea/vomiting,abdominal pain, uncontrolled pain, chest pain,  shortness of breath or any other alarming symptoms.  Please follow-up your doctor as instructed in a week time and call the office for appointment.  Please avoid alcohol, smoking, or any other illicit substance and maintain healthy habits including taking your regular medications as prescribed.  You were cared for by a hospitalist during your hospital stay. If you have any questions about your discharge medications or the care you received while you were in the hospital after you are discharged, you can call the unit and ask to speak with the hospitalist on call if the hospitalist that took care of you is not available.  Once you are discharged, your primary care physician will handle any further medical issues. Please note that NO REFILLS for any discharge medications will be authorized once you are discharged, as it is imperative that you return to your primary care physician (or establish a relationship with a primary care physician if you do not have one) for your aftercare needs so that they can reassess your need for medications and monitor your lab values   Increase activity slowly   Complete by: As directed       Allergies as of 01/14/2024       Reactions   Cefdinir Hives   Penicillins Hives, Other (See Comments)   Has patient had a PCN reaction causing immediate rash, facial/tongue/throat swelling, SOB or lightheadedness with hypotension: No Has patient had a PCN reaction causing severe rash involving mucus membranes or skin necrosis: No Has patient had a PCN reaction that required hospitalization No Has patient  had a PCN reaction occurring within the last 10 years: No If all of the above answers are "NO", then may proceed with Cephalosporin use.        Medication List     TAKE these medications    albuterol 108 (90 Base) MCG/ACT inhaler Commonly known as: VENTOLIN HFA Inhale 2 puffs into the lungs every 4 (four) hours as needed.   alprazolam 2 MG tablet Commonly known as: XANAX Take 2 mg by mouth 3 (three) times daily as needed for anxiety.   amphetamine-dextroamphetamine 30 MG tablet Commonly known as: ADDERALL Take 15 mg by mouth daily as needed (for hyperactivity).   cephALEXin 500 MG capsule Commonly known as: KEFLEX Take 1 capsule (500 mg total) by mouth 3 (three) times daily for 7 days.   cloNIDine 0.3 MG tablet Commonly known as: CATAPRES Take 0.3 mg by mouth at bedtime.   clotrimazole-betamethasone cream Commonly known as: LOTRISONE Apply 1 Application topically as needed (Skin irritation).   dicyclomine 20 MG tablet Commonly known as: BENTYL Take 1 tablet (20 mg total) by mouth 2 (two) times daily. What changed:  when to take this additional instructions  estradiol 2 MG tablet Commonly known as: ESTRACE Take 2 mg by mouth at bedtime.   ESTROVEN PO Take 1 each by mouth daily.   famotidine 20 MG tablet Commonly known as: PEPCID Take 20 mg by mouth 2 (two) times daily. Also takes as needed during the day   Flucelvax Quadrivalent 0.5 ML injection Generic drug: influenza vaccine   guaiFENesin-dextromethorphan 100-10 MG/5ML syrup Commonly known as: ROBITUSSIN DM Take 10 mLs by mouth every 4 (four) hours as needed for cough.   levothyroxine 50 MCG tablet Commonly known as: SYNTHROID Take 50 mcg by mouth every morning.   meclizine 25 MG tablet Commonly known as: ANTIVERT Take 25 mg by mouth 3 (three) times daily as needed for dizziness.   naloxone 4 MG/0.1ML Liqd nasal spray kit Commonly known as: NARCAN SMARTSIG:1-2 Spray(s) Both Nares As Directed    ondansetron 8 MG disintegrating tablet Commonly known as: ZOFRAN-ODT Take 8 mg by mouth as needed for nausea or vomiting.   oxyCODONE-acetaminophen 10-325 MG tablet Commonly known as: PERCOCET Take 1 tablet by mouth every 4 (four) hours as needed for pain. Start taking on: January 16, 2024   pantoprazole 40 MG tablet Commonly known as: PROTONIX Take 40 mg by mouth at bedtime.   QUEtiapine 400 MG tablet Commonly known as: SEROQUEL Take 400 mg by mouth at bedtime.   SUMAtriptan 100 MG tablet Commonly known as: IMITREX Take 100 mg by mouth as needed for migraine.   valACYclovir 1000 MG tablet Commonly known as: VALTREX Take 1,000 mg by mouth as needed (Outbreaks).   ZINC PO Take 1 tablet by mouth daily at 6 (six) AM.        Follow-up Information     Daisy Floro, MD Follow up in 1 week(s).   Specialty: Family Medicine Contact information: [address] [place] [address] [phone]                Allergies  Allergen Reactions   Cefdinir Hives   Penicillins Hives and Other (See Comments)    Has patient had a PCN reaction causing immediate rash, facial/tongue/throat swelling, SOB or lightheadedness with hypotension: No Has patient had a PCN reaction causing severe rash involving mucus membranes or skin necrosis: No Has patient had a PCN reaction that required hospitalization No Has patient had a PCN reaction occurring within the last 10 years: No If all of the above answers are "NO", then may proceed with Cephalosporin use.    The results of significant diagnostics from this hospitalization (including imaging, microbiology, ancillary and laboratory) are listed below for reference.    Microbiology: Recent Results (from the past 240 hours)  Resp panel by RT-PCR (RSV, Flu A&B, Covid) Throat     Status: None   Collection Time: 01/10/24 12:14 AM   Specimen: Throat; Nasal Swab  Result Value Ref Range Status   SARS Coronavirus 2 by RT PCR  NEGATIVE NEGATIVE Final    Comment: (NOTE) SARS-CoV-2 target nucleic acids are NOT DETECTED.  The SARS-CoV-2 RNA is generally detectable in upper respiratory specimens during the acute phase of infection. The lowest concentration of SARS-CoV-2 viral copies this assay can detect is 138 copies/mL. A negative result does not preclude SARS-Cov-2 infection and should not be used as the sole basis for treatment or other patient management decisions. A negative result may occur with  improper specimen collection/handling, submission of specimen other than nasopharyngeal swab, presence of viral mutation(s) within the areas targeted by this assay, and inadequate  number of viral copies(<138 copies/mL). A negative result must be combined with clinical observations, patient history, and epidemiological information. The expected result is Negative.  Fact Sheet for Patients:  BloggerCourse.com  Fact Sheet for Healthcare Providers:  SeriousBroker.it  This test is no t yet approved or cleared by the [place] FDA and  has been authorized for detection and/or diagnosis of SARS-CoV-2 by FDA under an Emergency Use Authorization (EUA). This EUA will remain  in effect (meaning this test can be used) for the duration of the COVID-19 declaration under Section 564(b)(1) of the Act, 21 U.S.C.section 360bbb-3(b)(1), unless the authorization is terminated  or revoked sooner.       Influenza A by PCR NEGATIVE NEGATIVE Final   Influenza B by PCR NEGATIVE NEGATIVE Final    Comment: (NOTE) The Xpert Xpress SARS-CoV-2/FLU/RSV plus assay is intended as an aid in the diagnosis of influenza from Nasopharyngeal swab specimens and should not be used as a sole basis for treatment. Nasal washings and aspirates are unacceptable for Xpert Xpress SARS-CoV-2/FLU/RSV testing.  Fact Sheet for Patients: BloggerCourse.com  Fact Sheet for  Healthcare Providers: SeriousBroker.it  This test is not yet approved or cleared by the [place] FDA and has been authorized for detection and/or diagnosis of SARS-CoV-2 by FDA under an Emergency Use Authorization (EUA). This EUA will remain in effect (meaning this test can be used) for the duration of the COVID-19 declaration under Section 564(b)(1) of the Act, 21 U.S.C. section 360bbb-3(b)(1), unless the authorization is terminated or revoked.     Resp Syncytial Virus by PCR NEGATIVE NEGATIVE Final    Comment: (NOTE) Fact Sheet for Patients: BloggerCourse.com  Fact Sheet for Healthcare Providers: SeriousBroker.it  This test is not yet approved or cleared by the [place] FDA and has been authorized for detection and/or diagnosis of SARS-CoV-2 by FDA under an Emergency Use Authorization (EUA). This EUA will remain in effect (meaning this test can be used) for the duration of the COVID-19 declaration under Section 564(b)(1) of the Act, 21 U.S.C. section 360bbb-3(b)(1), unless the authorization is terminated or revoked.  Performed at [facility], [address] [place], [place] [address]   Group A Strep by PCR     Status: Abnormal   Collection Time: 01/10/24 12:15 AM   Specimen: Throat; Sterile Swab  Result Value Ref Range Status   Group A Strep by PCR DETECTED (A) NOT DETECTED Final    Comment: Performed at [facility], [address] [place], [place] [address]  Culture, blood (Routine X 2) w Reflex to ID Panel     Status: None (Preliminary result)   Collection Time: 01/10/24  2:42 AM   Specimen: BLOOD  Result Value Ref Range Status   Specimen Description   Final    BLOOD BLOOD RIGHT ARM Performed at [facility], [address], [place], [place] [address]    Special Requests   Final    Blood Culture adequate volume BOTTLES DRAWN  AEROBIC AND ANAEROBIC Performed at [facility], [address] [place], [place] [address]    Culture   Final    NO GROWTH 4 DAYS Performed at [facility], [address], [place], [place] [address]    Report Status PENDING  Incomplete  Culture, blood (Routine X 2) w Reflex to ID Panel     Status: None (Preliminary result)   Collection Time: 01/10/24  5:10 PM   Specimen: BLOOD  Result  Value Ref Range Status   Specimen Description   Final    BLOOD BLOOD LEFT ARM AEROBIC BOTTLE ONLY ANAEROBIC BOTTLE ONLY Performed at [facility], [address], [place], [place] [address]    Special Requests   Final    BOTTLES DRAWN AEROBIC AND ANAEROBIC Blood Culture results may not be optimal due to an inadequate volume of blood received in culture bottles Performed at [facility], [address], [place], [place] [address]    Culture   Final    NO GROWTH 4 DAYS Performed at [facility], [address], [place], [place] [address]    Report Status PENDING  Incomplete    Procedures/Studies: DG Chest 2 View Result Date: 01/10/2024 CLINICAL DATA:  Cough and congestion. EXAM: CHEST - 2 VIEW COMPARISON:  December 04, 2022 FINDINGS: The heart size and mediastinal contours are within normal limits. Mild atelectasis and/or infiltrate is seen within the right lung base. Very mild atelectatic changes are also noted within the of periphery of the left lung base. No pleural effusion or pneumothorax is identified. Radiopaque surgical clips are seen within the right upper quadrant. The visualized skeletal structures are unremarkable. IMPRESSION: Mild right basilar atelectasis and/or infiltrate. Electronically Signed   By: Aram Candela M.D.   On: 01/10/2024 00:15   DG BONE DENSITY (DXA) Result Date: 12/26/2023 EXAM: DUAL X-RAY ABSORPTIOMETRY (DXA) FOR BONE MINERAL DENSITY IMPRESSION: Referring Physician:  Darlen Round ROSS Your patient  completed a bone mineral density test using GE Lunar iDXA system (analysis version: 16). Technologist: BEC PATIENT: Name: Alison Blake, Alison Blake Patient ID: [id number] Birth Date: 03/09/1971 Height: 66.5 in. Sex: Female Measured: 12/26/2023 Weight: 230.2 lbs. Indications: Albuterol, Bilateral Oophorectomy (65.51), Caucasian, Estrogen Deficient, Famotidine, Height Loss (781.91), High risk medication use, History of Fracture (Adult) (V15.51), Hyperthyroid (242.9), Hysterectomy, Levothyroxine, Pantoprazole, Postmenopausal, Secondary Osteoporosis Fractures: Right Ankle Treatments: Estrace ASSESSMENT: The BMD measured at Femur Neck Left is 0.788 g/cm2 with a T-score of -1.8. This patient is considered osteopenic/low bone mass according to World Health Organization [facility]) criteria. L3 was excluded due to degenerative changes. The quality of the exam is limited by patient body habitus. Site Region Measured Date Measured Age YA BMD Significant CHANGE T-score DualFemur Neck Left  12/26/[age]    52.5         -1.8    0.788 g/cm2 AP Spine  L1-L4 (L3) 12/26/[age]    52.5         -0.8    1.068 g/cm2 DualFemur Total Mean 12/26/2023    52.5         -1.3    0.841 g/cm2 World Health Organization [facility]) criteria for post-menopausal, Caucasian Women: Normal       T-score at or above -1 SD Osteopenia   T-score between -1 and -2.5 SD Osteoporosis T-score at or below -2.5 SD RECOMMENDATION: 1. All patients should optimize calcium and vitamin D intake. 2. Consider FDA-approved medical therapies in postmenopausal women and men aged 33 years and older, based on the following: a. A hip or vertebral (clinical or morphometric) fracture. b. T-score = -2.5 at the femoral neck or spine after appropriate evaluation to exclude secondary causes. c. Low bone mass (T-score between -1.0 and -2.5 at the femoral neck or spine) and a 10-year probability of a hip fracture = 3% or a 10-year probability of a major osteoporosis-related fracture = 20% based on the  [place]-adapted WHO algorithm. d. Clinician judgment and/or patient preferences may indicate  treatment for people with 10-year fracture probabilities above or below these levels. FOLLOW-UP: Patients with diagnosis of osteoporosis or at high risk for fracture should have regular bone mineral density tests. Patients eligible for Medicare are allowed routine testing every 2 years. The testing frequency can be increased to one year for patients who have rapidly progressing disease, are receiving or discontinuing medical therapy to restore bone mass, or have additional risk factors. I have reviewed this study and agree with the findings. [facility] FRAX* 10-year Probability of Fracture Based on femoral neck BMD: DualFemur (Left) Major Osteoporotic Fracture: 10.3% Hip Fracture:                1.2% Population:                  [place] (Caucasian) Risk Factors: History of Fracture (Adult) (V15.51), Secondary Osteoporosis *FRAX is a Armed forces logistics/support/administrative officer of the [employer] for Metabolic Bone Disease, a World Science writer (WHO) [employer]. ASSESSMENT: The probability of a major osteoporotic fracture is 10.3% within the next ten years. The probability of a hip fracture is 1.2% within the next ten years. Electronically Signed   By: Frederico Hamman M.D.   On: 12/26/2023 09:53   MM 3D SCREENING MAMMOGRAM BILATERAL BREAST Result Date: 12/18/2023 CLINICAL DATA:  Screening. EXAM: DIGITAL SCREENING BILATERAL MAMMOGRAM WITH TOMOSYNTHESIS AND CAD TECHNIQUE: Bilateral screening digital craniocaudal and mediolateral oblique mammograms were obtained. Bilateral screening digital breast tomosynthesis was performed. The images were evaluated with computer-aided detection. COMPARISON:  Previous exam(s). ACR Breast Density Category b: There are scattered areas of fibroglandular density. FINDINGS: There are no findings suspicious for malignancy. IMPRESSION: No mammographic evidence of  malignancy. A result letter of this screening mammogram will be mailed directly to the patient. RECOMMENDATION: Screening mammogram in one year. (Code:SM-B-01Y) BI-RADS CATEGORY  1: Negative. Electronically Signed   By: Amie Portland M.D.   On: 12/18/2023 15:13   Labs: BNP (last 3 results) No results for input(s): "BNP" in the last 8760 hours. Basic Metabolic Panel: Recent Labs  Lab 01/10/24 0000 01/11/24 0421 01/12/24 0436 01/13/24 0432 01/14/24 0437  NA 135 136 137 134* 138  K 3.7 4.2 3.3* 3.5 3.7  CL 101 107 105 100 103  CO2 22 22 25 25 23   GLUCOSE 152* 118* 106* 98 110*  BUN 11 9 <5* <5* 6  CREATININE 0.90 0.77 0.79 0.76 0.82  CALCIUM 8.8* 7.9* 8.0* 7.9* 8.9   Liver Function Tests: No results for input(s): "AST", "ALT", "ALKPHOS", "BILITOT", "PROT", "ALBUMIN" in the last 168 hours. No results for input(s): "LIPASE", "AMYLASE" in the last 168 hours. No results for input(s): "AMMONIA" in the last 168 hours. CBC: Recent Labs  Lab 01/10/24 0910 01/11/24 0421 01/12/24 0436 01/13/24 0432 01/14/24 0437  WBC 10.8* 11.7* 10.1 7.8 10.3  HGB 12.4 11.1* 10.7* 11.5* 13.8  HCT 39.6 35.9* 33.1* 35.2* 41.8  MCV 103.9* 105.0* 101.2* 102.6* 99.3  PLT 170 172 196 224 251   Recent Labs  Lab 01/11/24 0421 01/12/24 0436 01/13/24 0432 01/14/24 0437  WBC 11.7* 10.1 7.8 10.3  Microbiology Recent Results (from the past 240 hours)  Resp panel by RT-PCR (RSV, Flu A&B, Covid) Throat     Status: None   Collection Time: 01/10/24 12:14 AM   Specimen: Throat; Nasal Swab  Result Value Ref Range Status   SARS Coronavirus 2 by RT PCR NEGATIVE NEGATIVE Final    Comment: (NOTE) SARS-CoV-2 target nucleic acids are  NOT DETECTED.  The SARS-CoV-2 RNA is generally detectable in upper respiratory specimens during the acute phase of infection. The lowest concentration of SARS-CoV-2 viral copies this assay can detect is 138 copies/mL. A negative result does not preclude SARS-Cov-2 infection and  should not be used as the sole basis for treatment or other patient management decisions. A negative result may occur with  improper specimen collection/handling, submission of specimen other than nasopharyngeal swab, presence of viral mutation(s) within the areas targeted by this assay, and inadequate number of viral copies(<138 copies/mL). A negative result must be combined with clinical observations, patient history, and epidemiological information. The expected result is Negative.  Fact Sheet for Patients:  BloggerCourse.com  Fact Sheet for Healthcare Providers:  SeriousBroker.it  This test is no t yet approved or cleared by the [place] FDA and  has been authorized for detection and/or diagnosis of SARS-CoV-2 by FDA under an Emergency Use Authorization (EUA). This EUA will remain  in effect (meaning this test can be used) for the duration of the COVID-19 declaration under Section 564(b)(1) of the Act, 21 U.S.C.section 360bbb-3(b)(1), unless the authorization is terminated  or revoked sooner.       Influenza A by PCR NEGATIVE NEGATIVE Final   Influenza B by PCR NEGATIVE NEGATIVE Final    Comment: (NOTE) The Xpert Xpress SARS-CoV-2/FLU/RSV plus assay is intended as an aid in the diagnosis of influenza from Nasopharyngeal swab specimens and should not be used as a sole basis for treatment. Nasal washings and aspirates are unacceptable for Xpert Xpress SARS-CoV-2/FLU/RSV testing.  Fact Sheet for Patients: BloggerCourse.com  Fact Sheet for Healthcare Providers: SeriousBroker.it  This test is not yet approved or cleared by the [place] FDA and has been authorized for detection and/or diagnosis of SARS-CoV-2 by FDA under an Emergency Use Authorization (EUA). This EUA will remain in effect (meaning this test can be used) for the duration of the COVID-19 declaration  under Section 564(b)(1) of the Act, 21 U.S.C. section 360bbb-3(b)(1), unless the authorization is terminated or revoked.     Resp Syncytial Virus by PCR NEGATIVE NEGATIVE Final    Comment: (NOTE) Fact Sheet for Patients: BloggerCourse.com  Fact Sheet for Healthcare Providers: SeriousBroker.it  This test is not yet approved or cleared by the [place] FDA and has been authorized for detection and/or diagnosis of SARS-CoV-2 by FDA under an Emergency Use Authorization (EUA). This EUA will remain in effect (meaning this test can be used) for the duration of the COVID-19 declaration under Section 564(b)(1) of the Act, 21 U.S.C. section 360bbb-3(b)(1), unless the authorization is terminated or revoked.  Performed at [facility], [address], [place], [place] [address]   Group A Strep by PCR     Status: Abnormal   Collection Time: 01/10/24 12:15 AM   Specimen: Throat; Sterile Swab  Result Value Ref Range Status   Group A Strep by PCR DETECTED (A) NOT DETECTED Final    Comment: Performed at [facility], [address] [place], [place] [address]  Culture, blood (Routine X 2) w Reflex to ID Panel     Status: None (Preliminary result)   Collection Time: 01/10/24  2:42 AM   Specimen: BLOOD  Result Value Ref Range Status   Specimen Description   Final    BLOOD BLOOD RIGHT ARM Performed at [facility], [address] [place], [place] [address]    Special Requests   Final    Blood Culture adequate  volume BOTTLES DRAWN AEROBIC AND ANAEROBIC Performed at [facility], [address] [place], [place] [address]    Culture   Final    NO GROWTH 4 DAYS Performed at [facility], [address], [place], [place] [address]    Report Status PENDING  Incomplete  Culture, blood (Routine X 2) w Reflex to ID Panel     Status: None (Preliminary result)    Collection Time: 01/10/24  5:10 PM   Specimen: BLOOD  Result Value Ref Range Status   Specimen Description   Final    BLOOD BLOOD LEFT ARM AEROBIC BOTTLE ONLY ANAEROBIC BOTTLE ONLY Performed at [facility], [address], [place], [place] [address]    Special Requests   Final    BOTTLES DRAWN AEROBIC AND ANAEROBIC Blood Culture results may not be optimal due to an inadequate volume of blood received in culture bottles Performed at [facility], [address], [place], [place] [address]    Culture   Final    NO GROWTH 4 DAYS Performed at [facility], [address] [place], [place] [address]    Report Status PENDING  Incomplete  Time coordinating discharge: 25 minutes.  SIGNED: Lanae Boast, MD  [facility] 01/14/2024, 1:58 PM  If 7PM-7AM, please contact night-coverage [email or web address]

## 2024-01-14 NOTE — Progress Notes (Signed)
Pt discharge instructions and reviewed. Pt verbalized understanding. Medications were delivered to pt bedside. Pt transported to main entrance via wheelchair to personal vehicle with family member.

## 2024-01-14 NOTE — Plan of Care (Signed)
  Problem: Education: Goal: Knowledge of General Education information will improve Description: Including pain rating scale, medication(s)/side effects and non-pharmacologic comfort measures Outcome: Progressing   Problem: Health Behavior/Discharge Planning: Goal: Ability to manage health-related needs will improve Outcome: Progressing   Problem: Clinical Measurements: Goal: Ability to maintain clinical measurements within normal limits will improve Outcome: Progressing Goal: Will remain free from infection Outcome: Progressing Goal: Diagnostic test results will improve Outcome: Progressing Goal: Respiratory complications will improve Outcome: Progressing Goal: Cardiovascular complication will be avoided Outcome: Progressing   Problem: Activity: Goal: Risk for activity intolerance will decrease Outcome: Progressing   Problem: Nutrition: Goal: Adequate nutrition will be maintained Outcome: Progressing   Problem: Coping: Goal: Level of anxiety will decrease Outcome: Progressing   Problem: Elimination: Goal: Will not experience complications related to bowel motility Outcome: Progressing Goal: Will not experience complications related to urinary retention Outcome: Progressing   Problem: Pain Managment: Goal: General experience of comfort will improve and/or be controlled Outcome: Progressing   Problem: Safety: Goal: Ability to remain free from injury will improve Outcome: Progressing   Problem: Skin Integrity: Goal: Risk for impaired skin integrity will decrease Outcome: Progressing   Problem: Clinical Measurements: Goal: Ability to maintain a body temperature in the normal range will improve Outcome: Progressing   Problem: Respiratory: Goal: Ability to maintain adequate ventilation will improve Outcome: Progressing Goal: Ability to maintain a clear airway will improve Outcome: Progressing

## 2024-01-15 LAB — CULTURE, BLOOD (ROUTINE X 2)
Culture: NO GROWTH
Culture: NO GROWTH
Special Requests: ADEQUATE

## 2024-01-19 ENCOUNTER — Other Ambulatory Visit (HOSPITAL_COMMUNITY): Payer: Self-pay

## 2024-01-27 DIAGNOSIS — R899 Unspecified abnormal finding in specimens from other organs, systems and tissues: Secondary | ICD-10-CM | POA: Diagnosis not present

## 2024-01-27 DIAGNOSIS — G43109 Migraine with aura, not intractable, without status migrainosus: Secondary | ICD-10-CM | POA: Diagnosis not present

## 2024-01-27 DIAGNOSIS — Z09 Encounter for follow-up examination after completed treatment for conditions other than malignant neoplasm: Secondary | ICD-10-CM | POA: Diagnosis not present

## 2024-01-27 DIAGNOSIS — Z79899 Other long term (current) drug therapy: Secondary | ICD-10-CM | POA: Diagnosis not present

## 2024-01-27 DIAGNOSIS — J020 Streptococcal pharyngitis: Secondary | ICD-10-CM | POA: Diagnosis not present

## 2024-01-27 DIAGNOSIS — E7800 Pure hypercholesterolemia, unspecified: Secondary | ICD-10-CM | POA: Diagnosis not present

## 2024-01-27 DIAGNOSIS — R7303 Prediabetes: Secondary | ICD-10-CM | POA: Diagnosis not present

## 2024-01-27 DIAGNOSIS — R634 Abnormal weight loss: Secondary | ICD-10-CM | POA: Diagnosis not present

## 2024-02-18 ENCOUNTER — Other Ambulatory Visit (HOSPITAL_COMMUNITY): Payer: Self-pay

## 2024-02-18 DIAGNOSIS — G894 Chronic pain syndrome: Secondary | ICD-10-CM | POA: Diagnosis not present

## 2024-02-18 DIAGNOSIS — M47814 Spondylosis without myelopathy or radiculopathy, thoracic region: Secondary | ICD-10-CM | POA: Diagnosis not present

## 2024-02-18 DIAGNOSIS — M47816 Spondylosis without myelopathy or radiculopathy, lumbar region: Secondary | ICD-10-CM | POA: Diagnosis not present

## 2024-02-18 DIAGNOSIS — Z79891 Long term (current) use of opiate analgesic: Secondary | ICD-10-CM | POA: Diagnosis not present

## 2024-02-18 MED ORDER — OXYCODONE-ACETAMINOPHEN 10-325 MG PO TABS
1.0000 | ORAL_TABLET | ORAL | 0 refills | Status: AC | PRN
Start: 2024-02-18 — End: ?
  Filled 2024-02-18: qty 180, 30d supply, fill #0

## 2024-02-18 MED ORDER — OXYCODONE-ACETAMINOPHEN 10-325 MG PO TABS
1.0000 | ORAL_TABLET | ORAL | 0 refills | Status: AC | PRN
  Filled 2024-03-18: qty 180, 30d supply, fill #0

## 2024-03-17 ENCOUNTER — Other Ambulatory Visit (HOSPITAL_COMMUNITY): Payer: Self-pay

## 2024-03-18 ENCOUNTER — Other Ambulatory Visit (HOSPITAL_COMMUNITY): Payer: Self-pay

## 2024-04-19 ENCOUNTER — Other Ambulatory Visit (HOSPITAL_COMMUNITY): Payer: Self-pay

## 2024-04-19 DIAGNOSIS — Z79891 Long term (current) use of opiate analgesic: Secondary | ICD-10-CM | POA: Diagnosis not present

## 2024-04-19 DIAGNOSIS — M47814 Spondylosis without myelopathy or radiculopathy, thoracic region: Secondary | ICD-10-CM | POA: Diagnosis not present

## 2024-04-19 DIAGNOSIS — G894 Chronic pain syndrome: Secondary | ICD-10-CM | POA: Diagnosis not present

## 2024-04-19 DIAGNOSIS — M47816 Spondylosis without myelopathy or radiculopathy, lumbar region: Secondary | ICD-10-CM | POA: Diagnosis not present

## 2024-04-19 MED ORDER — OXYCODONE-ACETAMINOPHEN 10-325 MG PO TABS
1.0000 | ORAL_TABLET | ORAL | 0 refills | Status: AC | PRN
  Filled 2024-04-19: qty 180, 30d supply, fill #0

## 2024-04-19 MED ORDER — OXYCODONE-ACETAMINOPHEN 10-325 MG PO TABS
1.0000 | ORAL_TABLET | ORAL | 0 refills | Status: AC
  Filled 2024-05-20: qty 180, 30d supply, fill #0

## 2024-05-20 ENCOUNTER — Other Ambulatory Visit (HOSPITAL_COMMUNITY): Payer: Self-pay

## 2024-05-24 DIAGNOSIS — E039 Hypothyroidism, unspecified: Secondary | ICD-10-CM | POA: Diagnosis not present

## 2024-05-24 DIAGNOSIS — R7303 Prediabetes: Secondary | ICD-10-CM | POA: Diagnosis not present

## 2024-05-24 DIAGNOSIS — E7800 Pure hypercholesterolemia, unspecified: Secondary | ICD-10-CM | POA: Diagnosis not present

## 2024-05-24 DIAGNOSIS — Z79899 Other long term (current) drug therapy: Secondary | ICD-10-CM | POA: Diagnosis not present

## 2024-05-30 DIAGNOSIS — J45909 Unspecified asthma, uncomplicated: Secondary | ICD-10-CM | POA: Diagnosis not present

## 2024-05-30 DIAGNOSIS — K219 Gastro-esophageal reflux disease without esophagitis: Secondary | ICD-10-CM | POA: Diagnosis not present

## 2024-05-30 DIAGNOSIS — R7303 Prediabetes: Secondary | ICD-10-CM | POA: Diagnosis not present

## 2024-05-30 DIAGNOSIS — E039 Hypothyroidism, unspecified: Secondary | ICD-10-CM | POA: Diagnosis not present

## 2024-05-30 DIAGNOSIS — Z0000 Encounter for general adult medical examination without abnormal findings: Secondary | ICD-10-CM | POA: Diagnosis not present

## 2024-05-30 DIAGNOSIS — E7800 Pure hypercholesterolemia, unspecified: Secondary | ICD-10-CM | POA: Diagnosis not present

## 2024-05-30 DIAGNOSIS — K589 Irritable bowel syndrome without diarrhea: Secondary | ICD-10-CM | POA: Diagnosis not present

## 2024-05-30 DIAGNOSIS — G43109 Migraine with aura, not intractable, without status migrainosus: Secondary | ICD-10-CM | POA: Diagnosis not present

## 2024-06-04 DIAGNOSIS — M25572 Pain in left ankle and joints of left foot: Secondary | ICD-10-CM | POA: Diagnosis not present

## 2024-06-20 ENCOUNTER — Other Ambulatory Visit (HOSPITAL_COMMUNITY): Payer: Self-pay

## 2024-06-20 DIAGNOSIS — M47814 Spondylosis without myelopathy or radiculopathy, thoracic region: Secondary | ICD-10-CM | POA: Diagnosis not present

## 2024-06-20 DIAGNOSIS — Z79891 Long term (current) use of opiate analgesic: Secondary | ICD-10-CM | POA: Diagnosis not present

## 2024-06-20 DIAGNOSIS — M47816 Spondylosis without myelopathy or radiculopathy, lumbar region: Secondary | ICD-10-CM | POA: Diagnosis not present

## 2024-06-20 DIAGNOSIS — G894 Chronic pain syndrome: Secondary | ICD-10-CM | POA: Diagnosis not present

## 2024-06-20 MED ORDER — OXYCODONE-ACETAMINOPHEN 10-325 MG PO TABS
1.0000 | ORAL_TABLET | ORAL | 0 refills | Status: AC | PRN
  Filled 2024-06-20: qty 180, 30d supply, fill #0

## 2024-06-20 MED ORDER — OXYCODONE-ACETAMINOPHEN 10-325 MG PO TABS
1.0000 | ORAL_TABLET | ORAL | 0 refills | Status: DC | PRN
  Filled 2024-07-19: qty 180, 30d supply, fill #0

## 2024-06-30 ENCOUNTER — Other Ambulatory Visit (HOSPITAL_COMMUNITY): Payer: Self-pay

## 2024-06-30 MED ORDER — ALPRAZOLAM 2 MG PO TABS
2.0000 mg | ORAL_TABLET | Freq: Three times a day (TID) | ORAL | 1 refills | Status: AC | PRN
  Filled 2024-07-02: qty 90, 30d supply, fill #0
  Filled 2024-08-02: qty 90, 30d supply, fill #1

## 2024-07-01 ENCOUNTER — Other Ambulatory Visit (HOSPITAL_COMMUNITY): Payer: Self-pay

## 2024-07-02 ENCOUNTER — Other Ambulatory Visit (HOSPITAL_COMMUNITY): Payer: Self-pay

## 2024-07-19 ENCOUNTER — Other Ambulatory Visit (HOSPITAL_COMMUNITY): Payer: Self-pay

## 2024-08-02 ENCOUNTER — Other Ambulatory Visit: Payer: Self-pay

## 2024-08-02 ENCOUNTER — Other Ambulatory Visit (HOSPITAL_BASED_OUTPATIENT_CLINIC_OR_DEPARTMENT_OTHER): Payer: Self-pay

## 2024-08-02 ENCOUNTER — Other Ambulatory Visit (HOSPITAL_COMMUNITY): Payer: Self-pay

## 2024-08-16 ENCOUNTER — Other Ambulatory Visit (HOSPITAL_COMMUNITY): Payer: Self-pay

## 2024-08-16 MED ORDER — AMPHETAMINE-DEXTROAMPHETAMINE 30 MG PO TABS
30.0000 mg | ORAL_TABLET | ORAL | 0 refills | Status: AC
  Filled 2024-08-16: qty 45, 30d supply, fill #0

## 2024-08-17 ENCOUNTER — Other Ambulatory Visit (HOSPITAL_COMMUNITY): Payer: Self-pay

## 2024-08-17 DIAGNOSIS — Z79891 Long term (current) use of opiate analgesic: Secondary | ICD-10-CM | POA: Diagnosis not present

## 2024-08-17 DIAGNOSIS — G894 Chronic pain syndrome: Secondary | ICD-10-CM | POA: Diagnosis not present

## 2024-08-17 DIAGNOSIS — M47814 Spondylosis without myelopathy or radiculopathy, thoracic region: Secondary | ICD-10-CM | POA: Diagnosis not present

## 2024-08-17 DIAGNOSIS — M47816 Spondylosis without myelopathy or radiculopathy, lumbar region: Secondary | ICD-10-CM | POA: Diagnosis not present

## 2024-08-17 MED ORDER — FLUZONE 0.5 ML IM SUSY
0.5000 mL | PREFILLED_SYRINGE | Freq: Once | INTRAMUSCULAR | 0 refills | Status: AC
  Filled 2024-08-17: qty 0.5, 1d supply, fill #0

## 2024-08-17 MED ORDER — OXYCODONE-ACETAMINOPHEN 10-325 MG PO TABS
1.0000 | ORAL_TABLET | ORAL | 0 refills | Status: AC | PRN
  Filled 2024-08-17: qty 180, 30d supply, fill #0

## 2024-08-17 MED ORDER — OXYCODONE-ACETAMINOPHEN 10-325 MG PO TABS
1.0000 | ORAL_TABLET | ORAL | 0 refills | Status: AC | PRN
  Filled 2024-09-16: qty 180, 30d supply, fill #0

## 2024-09-01 ENCOUNTER — Other Ambulatory Visit: Payer: Self-pay

## 2024-09-01 ENCOUNTER — Other Ambulatory Visit (HOSPITAL_COMMUNITY): Payer: Self-pay

## 2024-09-01 MED ORDER — ALPRAZOLAM 2 MG PO TABS
2.0000 mg | ORAL_TABLET | Freq: Three times a day (TID) | ORAL | 0 refills | Status: DC | PRN
  Filled 2024-09-01: qty 90, 30d supply, fill #0

## 2024-09-02 ENCOUNTER — Other Ambulatory Visit (HOSPITAL_COMMUNITY): Payer: Self-pay

## 2024-09-15 ENCOUNTER — Other Ambulatory Visit (HOSPITAL_COMMUNITY): Payer: Self-pay

## 2024-09-15 MED ORDER — QUETIAPINE FUMARATE 400 MG PO TABS
400.0000 mg | ORAL_TABLET | Freq: Every day | ORAL | 3 refills | Status: AC
  Filled 2024-09-15 – 2024-09-20 (×2): qty 90, 90d supply, fill #0
  Filled 2024-12-10: qty 90, 90d supply, fill #1

## 2024-09-15 MED ORDER — AMPHETAMINE-DEXTROAMPHETAMINE 30 MG PO TABS
30.0000 mg | ORAL_TABLET | Freq: Two times a day (BID) | ORAL | 0 refills | Status: AC
  Filled 2024-10-17: qty 45, 30d supply, fill #0

## 2024-09-15 MED ORDER — CLONIDINE HCL 0.3 MG PO TABS
0.3000 mg | ORAL_TABLET | Freq: Every day | ORAL | 3 refills | Status: AC
  Filled 2024-09-15: qty 90, 90d supply, fill #0
  Filled 2024-12-10: qty 90, 90d supply, fill #1

## 2024-09-15 MED ORDER — AMPHETAMINE-DEXTROAMPHETAMINE 30 MG PO TABS
30.0000 mg | ORAL_TABLET | Freq: Two times a day (BID) | ORAL | 0 refills | Status: AC
  Filled 2024-11-28: qty 45, 23d supply, fill #0

## 2024-09-15 MED ORDER — AMPHETAMINE-DEXTROAMPHETAMINE 30 MG PO TABS
30.0000 mg | ORAL_TABLET | Freq: Two times a day (BID) | ORAL | 0 refills | Status: AC
  Filled 2024-09-15: qty 45, 30d supply, fill #0

## 2024-09-16 ENCOUNTER — Other Ambulatory Visit: Payer: Self-pay

## 2024-09-16 ENCOUNTER — Other Ambulatory Visit (HOSPITAL_COMMUNITY): Payer: Self-pay

## 2024-09-19 ENCOUNTER — Other Ambulatory Visit (HOSPITAL_COMMUNITY): Payer: Self-pay

## 2024-09-20 ENCOUNTER — Other Ambulatory Visit (HOSPITAL_COMMUNITY): Payer: Self-pay

## 2024-09-20 ENCOUNTER — Other Ambulatory Visit: Payer: Self-pay

## 2024-09-21 ENCOUNTER — Other Ambulatory Visit (HOSPITAL_COMMUNITY): Payer: Self-pay

## 2024-09-29 ENCOUNTER — Other Ambulatory Visit (HOSPITAL_COMMUNITY): Payer: Self-pay

## 2024-10-03 ENCOUNTER — Other Ambulatory Visit (HOSPITAL_COMMUNITY): Payer: Self-pay

## 2024-10-03 MED ORDER — ALPRAZOLAM 2 MG PO TABS
2.0000 mg | ORAL_TABLET | Freq: Three times a day (TID) | ORAL | 2 refills | Status: AC | PRN
  Filled 2024-10-03: qty 90, 30d supply, fill #0
  Filled 2024-11-02: qty 90, 30d supply, fill #1
  Filled 2024-12-03: qty 90, 30d supply, fill #2

## 2024-10-12 ENCOUNTER — Other Ambulatory Visit (HOSPITAL_COMMUNITY): Payer: Self-pay

## 2024-10-12 MED ORDER — OXYCODONE-ACETAMINOPHEN 10-325 MG PO TABS
1.0000 | ORAL_TABLET | ORAL | 0 refills | Status: AC | PRN
  Filled 2024-10-17: qty 180, 30d supply, fill #0

## 2024-10-12 MED ORDER — OXYCODONE-ACETAMINOPHEN 10-325 MG PO TABS
1.0000 | ORAL_TABLET | ORAL | 0 refills | Status: DC | PRN
  Filled 2024-11-16: qty 180, 30d supply, fill #0

## 2024-10-17 ENCOUNTER — Other Ambulatory Visit (HOSPITAL_COMMUNITY): Payer: Self-pay

## 2024-11-02 ENCOUNTER — Other Ambulatory Visit (HOSPITAL_COMMUNITY): Payer: Self-pay

## 2024-11-03 ENCOUNTER — Other Ambulatory Visit: Payer: Self-pay

## 2024-11-03 ENCOUNTER — Institutional Professional Consult (permissible substitution) (INDEPENDENT_AMBULATORY_CARE_PROVIDER_SITE_OTHER)

## 2024-11-16 ENCOUNTER — Other Ambulatory Visit (HOSPITAL_COMMUNITY): Payer: Self-pay

## 2024-11-28 ENCOUNTER — Other Ambulatory Visit (HOSPITAL_COMMUNITY): Payer: Self-pay

## 2024-12-03 ENCOUNTER — Other Ambulatory Visit (HOSPITAL_COMMUNITY): Payer: Self-pay

## 2024-12-05 ENCOUNTER — Other Ambulatory Visit: Payer: Self-pay

## 2024-12-05 ENCOUNTER — Other Ambulatory Visit (HOSPITAL_COMMUNITY): Payer: Self-pay

## 2024-12-15 ENCOUNTER — Other Ambulatory Visit (HOSPITAL_COMMUNITY): Payer: Self-pay

## 2024-12-15 MED ORDER — OXYCODONE-ACETAMINOPHEN 10-325 MG PO TABS
1.0000 | ORAL_TABLET | ORAL | 0 refills | Status: AC | PRN
  Filled 2024-12-15: qty 180, 30d supply, fill #0

## 2024-12-15 MED ORDER — OXYCODONE-ACETAMINOPHEN 10-325 MG PO TABS: 1.0000 | ORAL_TABLET | ORAL | 0 refills | Status: AC | PRN

## 2025-01-03 ENCOUNTER — Other Ambulatory Visit (HOSPITAL_COMMUNITY): Payer: Self-pay

## 2025-01-04 ENCOUNTER — Other Ambulatory Visit: Payer: Self-pay

## 2025-01-04 ENCOUNTER — Other Ambulatory Visit (HOSPITAL_COMMUNITY): Payer: Self-pay

## 2025-01-04 MED ORDER — ALPRAZOLAM 2 MG PO TABS
ORAL_TABLET | ORAL | 1 refills | Status: AC
  Filled 2025-01-04: qty 90, 30d supply, fill #0

## 2025-01-05 ENCOUNTER — Other Ambulatory Visit (HOSPITAL_COMMUNITY): Payer: Self-pay
